# Patient Record
Sex: Female | Born: 1965 | Race: Black or African American | Hispanic: No | Marital: Married | Smoking: Never smoker
Health system: Southern US, Community
[De-identification: ages and names within clinical notes are randomized; demographics above are authoritative.]

## PROBLEM LIST (undated history)

## (undated) DIAGNOSIS — D649 Anemia, unspecified: Secondary | ICD-10-CM

## (undated) DIAGNOSIS — Z86718 Personal history of other venous thrombosis and embolism: Secondary | ICD-10-CM

## (undated) DIAGNOSIS — B019 Varicella without complication: Secondary | ICD-10-CM

## (undated) DIAGNOSIS — D689 Coagulation defect, unspecified: Secondary | ICD-10-CM

## (undated) HISTORY — PX: TUBAL LIGATION: SHX77

## (undated) HISTORY — DX: Personal history of other venous thrombosis and embolism: Z86.718

## (undated) HISTORY — PX: UMBILICAL HERNIA REPAIR: SHX196

## (undated) HISTORY — DX: Coagulation defect, unspecified: D68.9

## (undated) HISTORY — DX: Varicella without complication: B01.9

---

## 2000-08-14 ENCOUNTER — Other Ambulatory Visit: Admission: RE | Admit: 2000-08-14 | Discharge: 2000-08-14 | Payer: Self-pay | Admitting: Obstetrics and Gynecology

## 2001-07-28 ENCOUNTER — Encounter: Payer: Self-pay | Admitting: Obstetrics and Gynecology

## 2001-07-28 ENCOUNTER — Ambulatory Visit (HOSPITAL_COMMUNITY): Admission: RE | Admit: 2001-07-28 | Discharge: 2001-07-28 | Payer: Self-pay | Admitting: Obstetrics and Gynecology

## 2001-08-20 ENCOUNTER — Encounter: Payer: Self-pay | Admitting: Obstetrics and Gynecology

## 2001-08-20 ENCOUNTER — Inpatient Hospital Stay (HOSPITAL_COMMUNITY): Admission: AD | Admit: 2001-08-20 | Discharge: 2001-08-20 | Payer: Self-pay | Admitting: Obstetrics and Gynecology

## 2001-10-06 ENCOUNTER — Ambulatory Visit (HOSPITAL_COMMUNITY): Admission: RE | Admit: 2001-10-06 | Discharge: 2001-10-06 | Payer: Self-pay | Admitting: Obstetrics and Gynecology

## 2001-10-06 ENCOUNTER — Encounter: Payer: Self-pay | Admitting: Obstetrics and Gynecology

## 2002-01-10 ENCOUNTER — Ambulatory Visit (HOSPITAL_COMMUNITY): Admission: RE | Admit: 2002-01-10 | Discharge: 2002-01-10 | Payer: Self-pay | Admitting: Obstetrics and Gynecology

## 2002-02-06 ENCOUNTER — Inpatient Hospital Stay (HOSPITAL_COMMUNITY): Admission: AD | Admit: 2002-02-06 | Discharge: 2002-02-06 | Payer: Self-pay | Admitting: Obstetrics and Gynecology

## 2002-02-07 ENCOUNTER — Inpatient Hospital Stay (HOSPITAL_COMMUNITY): Admission: AD | Admit: 2002-02-07 | Discharge: 2002-02-09 | Payer: Self-pay | Admitting: Obstetrics and Gynecology

## 2002-02-09 ENCOUNTER — Ambulatory Visit (HOSPITAL_COMMUNITY): Admission: EM | Admit: 2002-02-09 | Discharge: 2002-02-09 | Payer: Self-pay | Admitting: Emergency Medicine

## 2002-02-09 ENCOUNTER — Encounter (INDEPENDENT_AMBULATORY_CARE_PROVIDER_SITE_OTHER): Payer: Self-pay | Admitting: Specialist

## 2002-02-10 ENCOUNTER — Encounter: Admission: RE | Admit: 2002-02-10 | Discharge: 2002-03-12 | Payer: Self-pay | Admitting: Obstetrics and Gynecology

## 2002-04-12 ENCOUNTER — Encounter: Admission: RE | Admit: 2002-04-12 | Discharge: 2002-05-12 | Payer: Self-pay | Admitting: Obstetrics and Gynecology

## 2002-05-13 ENCOUNTER — Encounter: Admission: RE | Admit: 2002-05-13 | Discharge: 2002-06-12 | Payer: Self-pay | Admitting: Obstetrics and Gynecology

## 2002-07-12 ENCOUNTER — Encounter: Admission: RE | Admit: 2002-07-12 | Discharge: 2002-08-11 | Payer: Self-pay | Admitting: Obstetrics and Gynecology

## 2003-08-11 ENCOUNTER — Other Ambulatory Visit: Admission: RE | Admit: 2003-08-11 | Discharge: 2003-08-11 | Payer: Self-pay | Admitting: Obstetrics and Gynecology

## 2008-08-06 ENCOUNTER — Ambulatory Visit: Payer: Self-pay | Admitting: Diagnostic Radiology

## 2008-08-06 ENCOUNTER — Emergency Department (HOSPITAL_BASED_OUTPATIENT_CLINIC_OR_DEPARTMENT_OTHER): Admission: EM | Admit: 2008-08-06 | Discharge: 2008-08-06 | Payer: Self-pay | Admitting: Emergency Medicine

## 2009-02-26 ENCOUNTER — Inpatient Hospital Stay (HOSPITAL_COMMUNITY): Admission: AD | Admit: 2009-02-26 | Discharge: 2009-02-26 | Payer: Self-pay | Admitting: Obstetrics & Gynecology

## 2009-03-13 ENCOUNTER — Ambulatory Visit: Payer: Self-pay | Admitting: Obstetrics & Gynecology

## 2009-03-13 ENCOUNTER — Encounter: Payer: Self-pay | Admitting: Obstetrics & Gynecology

## 2009-03-13 LAB — CONVERTED CEMR LAB
Basophils Absolute: 0 10*3/uL (ref 0.0–0.1)
Basophils Relative: 1 % (ref 0–1)
Eosinophils Absolute: 0.2 10*3/uL (ref 0.0–0.7)
Hemoglobin: 11.7 g/dL — ABNORMAL LOW (ref 12.0–15.0)
Hgb A2 Quant: 2.5 % (ref 2.2–3.2)
Hgb A: 97.5 % (ref 96.8–97.8)
Monocytes Absolute: 0.8 10*3/uL (ref 0.1–1.0)
Monocytes Relative: 10 % (ref 3–12)
Neutrophils Relative %: 70 % (ref 43–77)
Platelets: 272 10*3/uL (ref 150–400)
RBC: 3.96 M/uL (ref 3.87–5.11)
RDW: 15.4 % (ref 11.5–15.5)
WBC: 8.7 10*3/uL (ref 4.0–10.5)

## 2009-03-15 ENCOUNTER — Ambulatory Visit: Payer: Self-pay | Admitting: Obstetrics & Gynecology

## 2009-03-15 ENCOUNTER — Encounter: Payer: Self-pay | Admitting: Obstetrics & Gynecology

## 2009-03-29 ENCOUNTER — Ambulatory Visit (HOSPITAL_COMMUNITY): Admission: RE | Admit: 2009-03-29 | Discharge: 2009-03-29 | Payer: Self-pay | Admitting: Obstetrics & Gynecology

## 2009-04-12 ENCOUNTER — Ambulatory Visit: Payer: Self-pay | Admitting: Family Medicine

## 2009-04-19 ENCOUNTER — Encounter: Payer: Self-pay | Admitting: Family Medicine

## 2009-04-19 ENCOUNTER — Ambulatory Visit (HOSPITAL_COMMUNITY): Admission: RE | Admit: 2009-04-19 | Discharge: 2009-04-19 | Payer: Self-pay | Admitting: Family Medicine

## 2009-04-26 ENCOUNTER — Ambulatory Visit: Payer: Self-pay | Admitting: Obstetrics & Gynecology

## 2009-05-10 ENCOUNTER — Ambulatory Visit (HOSPITAL_COMMUNITY): Admission: RE | Admit: 2009-05-10 | Discharge: 2009-05-10 | Payer: Self-pay | Admitting: Family Medicine

## 2009-05-10 ENCOUNTER — Encounter: Payer: Self-pay | Admitting: Family Medicine

## 2009-05-17 ENCOUNTER — Inpatient Hospital Stay (HOSPITAL_COMMUNITY): Admission: AD | Admit: 2009-05-17 | Discharge: 2009-05-17 | Payer: Self-pay | Admitting: Obstetrics & Gynecology

## 2009-05-17 ENCOUNTER — Ambulatory Visit: Payer: Self-pay | Admitting: Family Medicine

## 2009-05-31 ENCOUNTER — Encounter: Payer: Self-pay | Admitting: Obstetrics & Gynecology

## 2009-05-31 ENCOUNTER — Ambulatory Visit: Payer: Self-pay | Admitting: Obstetrics & Gynecology

## 2009-05-31 LAB — CONVERTED CEMR LAB
HCT: 34.3 % — ABNORMAL LOW (ref 36.0–46.0)
Hemoglobin: 11.1 g/dL — ABNORMAL LOW (ref 12.0–15.0)
MCHC: 32.4 g/dL (ref 30.0–36.0)
RBC: 3.77 M/uL — ABNORMAL LOW (ref 3.87–5.11)

## 2009-06-14 ENCOUNTER — Ambulatory Visit: Payer: Self-pay | Admitting: Obstetrics & Gynecology

## 2009-06-21 ENCOUNTER — Ambulatory Visit (HOSPITAL_COMMUNITY): Admission: RE | Admit: 2009-06-21 | Discharge: 2009-06-21 | Payer: Self-pay | Admitting: Family Medicine

## 2009-06-21 ENCOUNTER — Encounter: Payer: Self-pay | Admitting: Family Medicine

## 2009-06-28 ENCOUNTER — Ambulatory Visit: Payer: Self-pay | Admitting: Family Medicine

## 2009-06-29 ENCOUNTER — Ambulatory Visit: Payer: Self-pay | Admitting: Obstetrics & Gynecology

## 2009-07-05 ENCOUNTER — Ambulatory Visit: Payer: Self-pay | Admitting: Obstetrics & Gynecology

## 2009-07-12 ENCOUNTER — Ambulatory Visit: Payer: Self-pay | Admitting: Obstetrics & Gynecology

## 2009-07-26 ENCOUNTER — Encounter: Payer: Self-pay | Admitting: Obstetrics & Gynecology

## 2009-07-26 ENCOUNTER — Ambulatory Visit: Payer: Self-pay | Admitting: Obstetrics & Gynecology

## 2009-07-31 ENCOUNTER — Inpatient Hospital Stay (HOSPITAL_COMMUNITY): Admission: AD | Admit: 2009-07-31 | Discharge: 2009-07-31 | Payer: Self-pay | Admitting: Family Medicine

## 2009-07-31 ENCOUNTER — Ambulatory Visit: Payer: Self-pay | Admitting: Family

## 2009-08-02 ENCOUNTER — Ambulatory Visit: Payer: Self-pay | Admitting: Obstetrics & Gynecology

## 2009-08-09 ENCOUNTER — Ambulatory Visit: Payer: Self-pay | Admitting: Obstetrics & Gynecology

## 2009-08-16 ENCOUNTER — Ambulatory Visit: Payer: Self-pay | Admitting: Obstetrics and Gynecology

## 2009-08-16 ENCOUNTER — Encounter (INDEPENDENT_AMBULATORY_CARE_PROVIDER_SITE_OTHER): Payer: Self-pay | Admitting: *Deleted

## 2009-08-23 ENCOUNTER — Ambulatory Visit: Payer: Self-pay | Admitting: Obstetrics & Gynecology

## 2009-08-25 ENCOUNTER — Inpatient Hospital Stay (HOSPITAL_COMMUNITY): Admission: AD | Admit: 2009-08-25 | Discharge: 2009-08-27 | Payer: Self-pay | Admitting: Family Medicine

## 2009-08-25 ENCOUNTER — Ambulatory Visit: Payer: Self-pay | Admitting: Family Medicine

## 2009-08-28 ENCOUNTER — Inpatient Hospital Stay (HOSPITAL_COMMUNITY): Admission: AD | Admit: 2009-08-28 | Discharge: 2009-08-28 | Payer: Self-pay | Admitting: Obstetrics & Gynecology

## 2009-08-28 ENCOUNTER — Ambulatory Visit: Payer: Self-pay | Admitting: Advanced Practice Midwife

## 2009-10-26 ENCOUNTER — Ambulatory Visit: Payer: Self-pay | Admitting: Obstetrics and Gynecology

## 2010-03-08 IMAGING — US US OB FOLLOW-UP
1 series · 14 of 28 positions shown · non-contrast
Comparison: none

OBSTETRICAL ULTRASOUND:
 This ultrasound was performed in The [HOSPITAL], and the [facility] report will be stored to [REDACTED] PACS.  This report is also available in [HOSPITAL]?s accessANYware.

[Series 1: us ob follow-up · 14 of 52 slices shown]
[im 2/52]
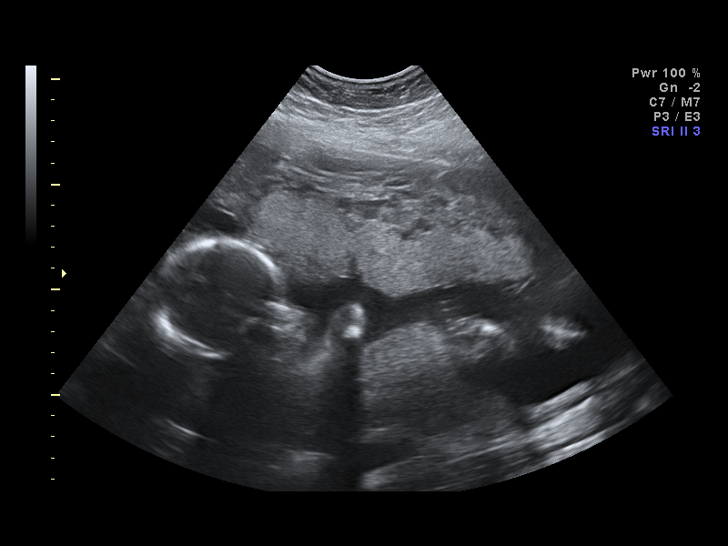
[im 6/52]
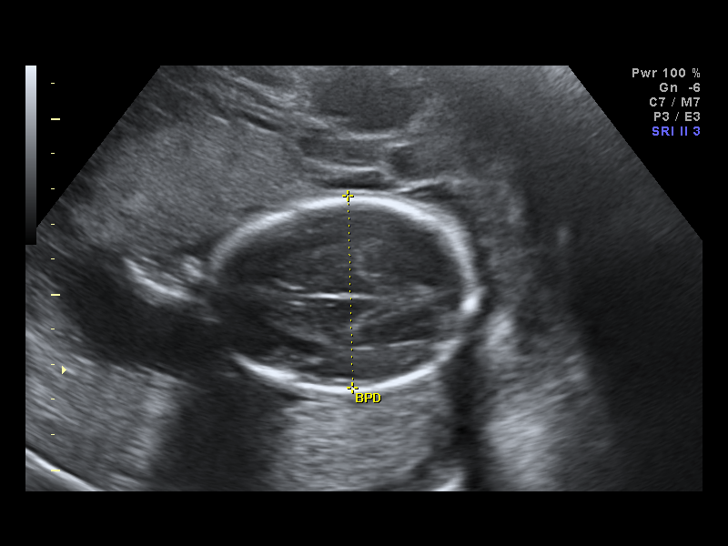
[im 10/52]
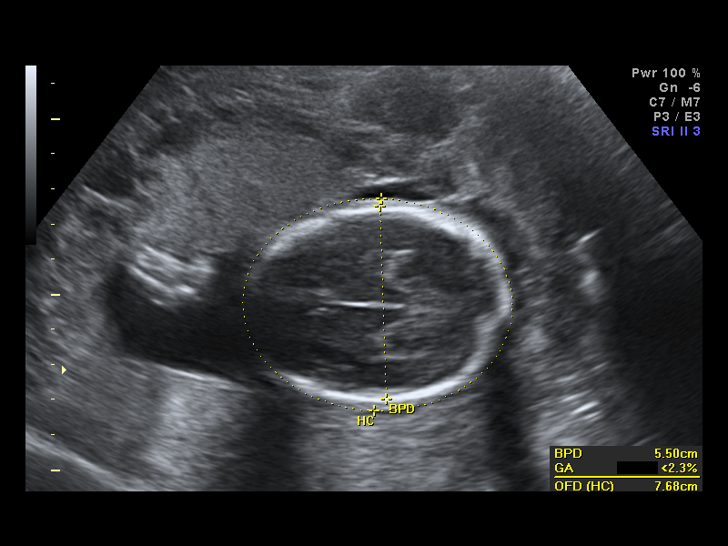
[im 14/52]
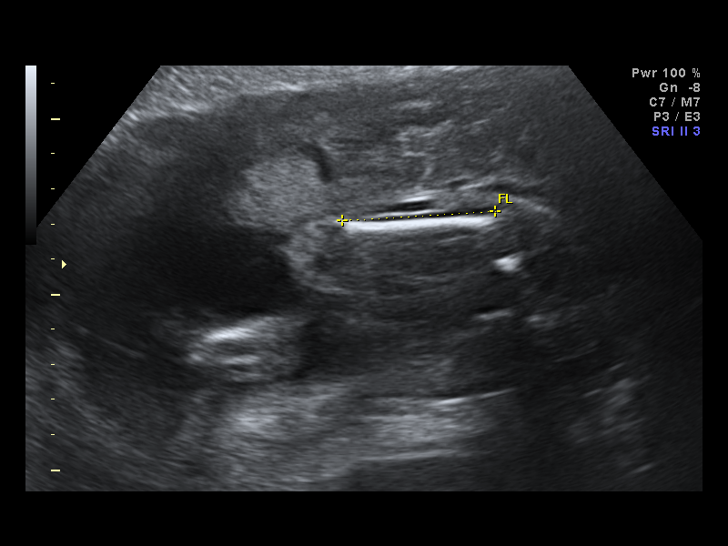
[im 18/52]
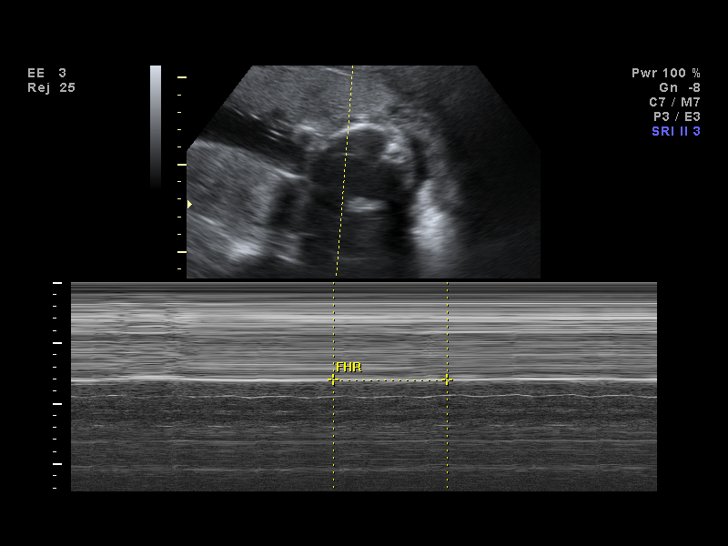
[im 21/52]
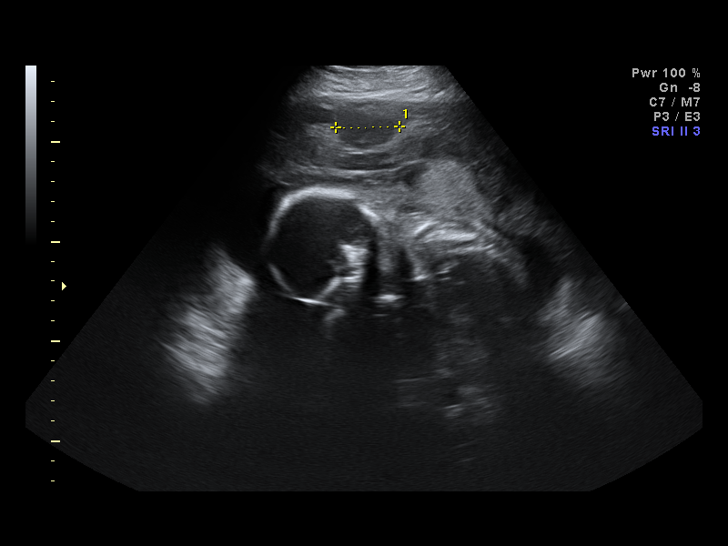
[im 25/52]
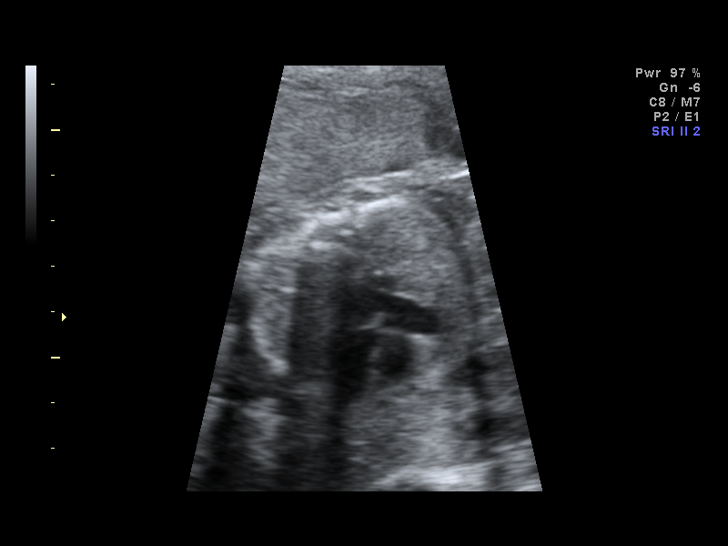
[im 29/52]
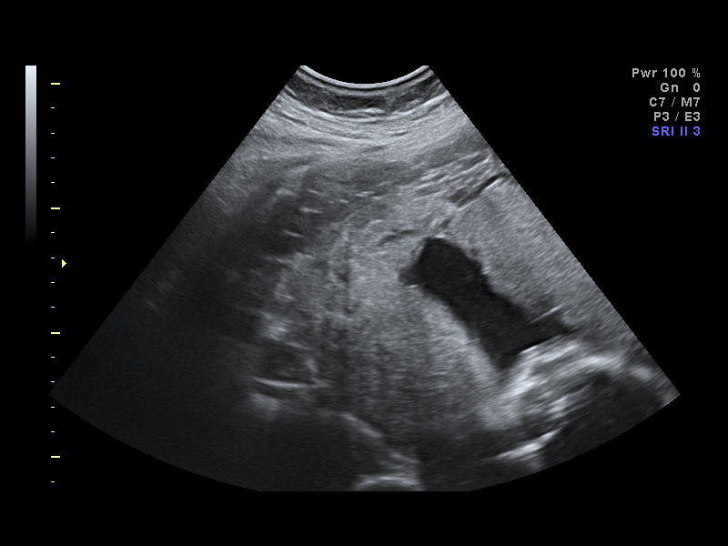
[im 33/52]
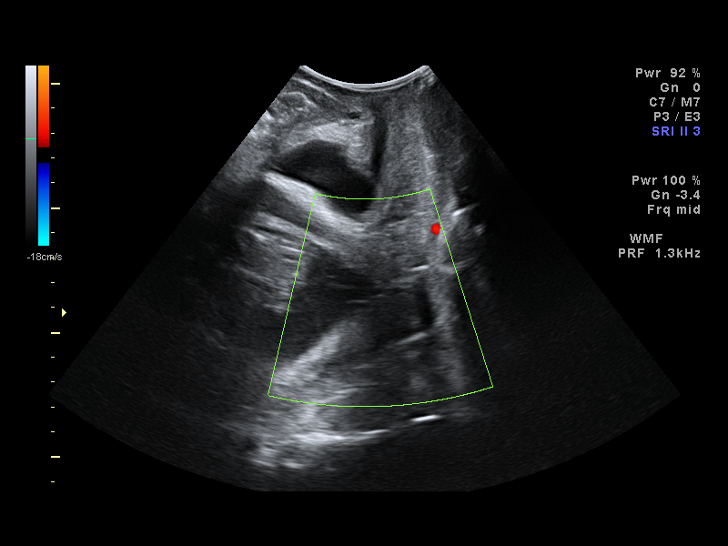
[im 36/52]
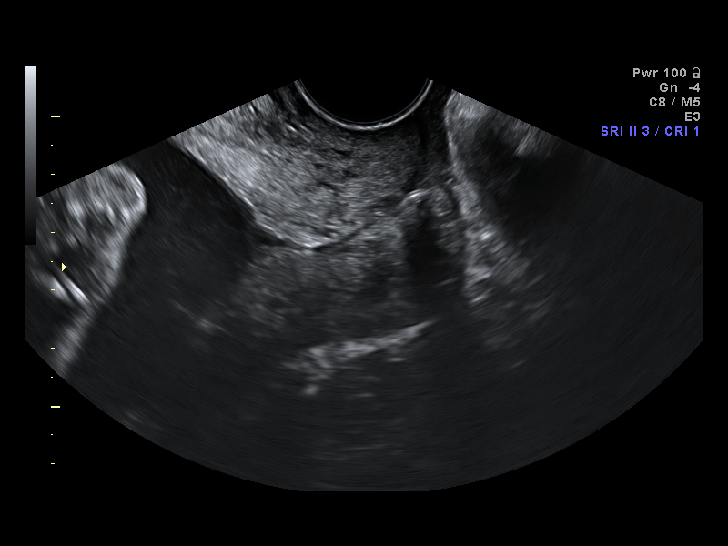
[im 40/52]
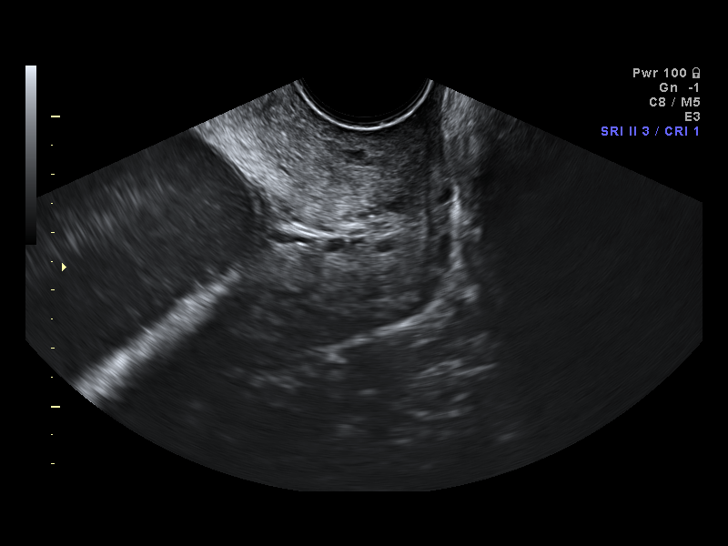
[im 44/52]
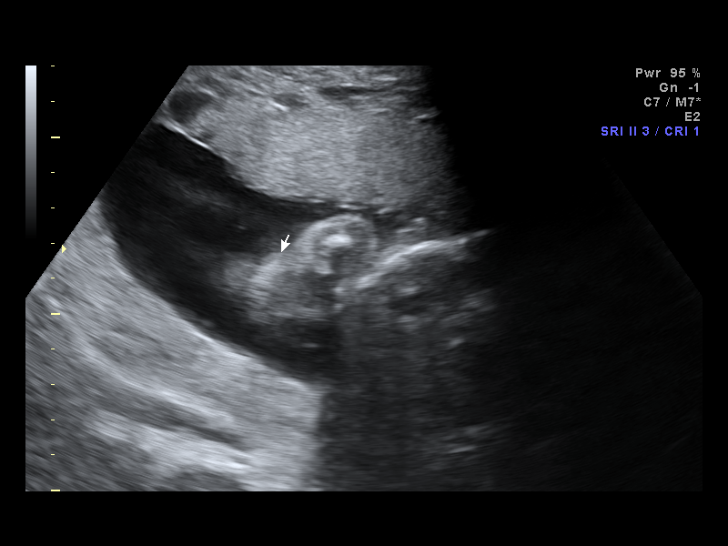
[im 48/52]
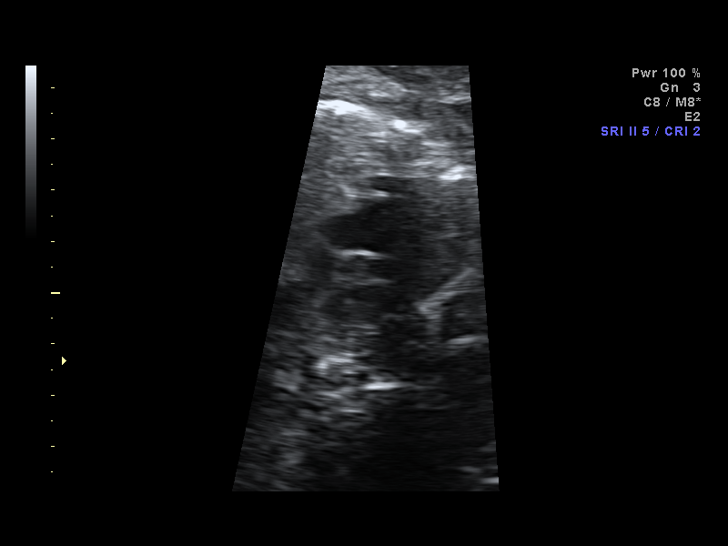
[im 52/52]
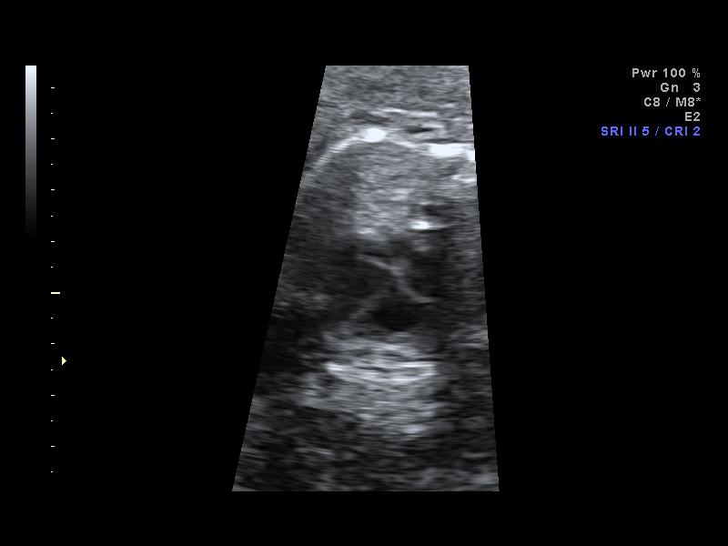

[14 of 28 positions shown; findings below may reference images not displayed]

IMPRESSION: [facility] has also been faxed to the ordering physician.

## 2010-06-16 LAB — POCT URINALYSIS DIP (DEVICE)
Bilirubin Urine: NEGATIVE
Bilirubin Urine: NEGATIVE
Glucose, UA: NEGATIVE mg/dL
Hgb urine dipstick: NEGATIVE
Nitrite: NEGATIVE
Protein, ur: NEGATIVE mg/dL
Specific Gravity, Urine: 1.015 (ref 1.005–1.030)
Specific Gravity, Urine: 1.02 (ref 1.005–1.030)
Urobilinogen, UA: 0.2 mg/dL (ref 0.0–1.0)
Urobilinogen, UA: 0.2 mg/dL (ref 0.0–1.0)
pH: 7 (ref 5.0–8.0)
pH: 6.5 (ref 5.0–8.0)

## 2010-06-17 LAB — CBC
HCT: 26.5 % — ABNORMAL LOW (ref 36.0–46.0)
HCT: 31 % — ABNORMAL LOW (ref 36.0–46.0)
Hemoglobin: 10.5 g/dL — ABNORMAL LOW (ref 12.0–15.0)
Hemoglobin: 8.5 g/dL — ABNORMAL LOW (ref 12.0–15.0)
MCHC: 32.1 g/dL (ref 30.0–36.0)
MCHC: 33.8 g/dL (ref 30.0–36.0)
MCV: 89.2 fL (ref 78.0–100.0)
MCV: 91.6 fL (ref 78.0–100.0)
Platelets: 180 K/uL (ref 150–400)
Platelets: 211 K/uL (ref 150–400)
RBC: 2.89 MIL/uL — ABNORMAL LOW (ref 3.87–5.11)
RBC: 3.47 MIL/uL — ABNORMAL LOW (ref 3.87–5.11)
RDW: 16.9 % — ABNORMAL HIGH (ref 11.5–15.5)
RDW: 17.3 % — ABNORMAL HIGH (ref 11.5–15.5)
WBC: 6.4 K/uL (ref 4.0–10.5)
WBC: 7.1 K/uL (ref 4.0–10.5)

## 2010-06-17 LAB — POCT URINALYSIS DIP (DEVICE)
Bilirubin Urine: NEGATIVE
Glucose, UA: NEGATIVE mg/dL
Glucose, UA: NEGATIVE mg/dL
Hgb urine dipstick: NEGATIVE
Nitrite: NEGATIVE
Nitrite: NEGATIVE
Urobilinogen, UA: 1 mg/dL (ref 0.0–1.0)
pH: 6.5 (ref 5.0–8.0)
pH: 6.5 (ref 5.0–8.0)

## 2010-06-17 LAB — RPR: RPR Ser Ql: NONREACTIVE

## 2010-06-18 LAB — POCT URINALYSIS DIP (DEVICE)
Bilirubin Urine: NEGATIVE
Bilirubin Urine: NEGATIVE
Glucose, UA: NEGATIVE mg/dL
Glucose, UA: NEGATIVE mg/dL
Glucose, UA: NEGATIVE mg/dL
Hgb urine dipstick: NEGATIVE
Hgb urine dipstick: NEGATIVE
Ketones, ur: NEGATIVE mg/dL
Ketones, ur: NEGATIVE mg/dL
Nitrite: NEGATIVE
Nitrite: NEGATIVE
Protein, ur: NEGATIVE mg/dL
Specific Gravity, Urine: 1.015 (ref 1.005–1.030)
pH: 6.5 (ref 5.0–8.0)

## 2010-06-19 LAB — POCT URINALYSIS DIP (DEVICE)
Bilirubin Urine: NEGATIVE
Bilirubin Urine: NEGATIVE
Hgb urine dipstick: NEGATIVE
Hgb urine dipstick: NEGATIVE
Hgb urine dipstick: NEGATIVE
Ketones, ur: NEGATIVE mg/dL
Nitrite: NEGATIVE
Nitrite: NEGATIVE
Protein, ur: 30 mg/dL — AB
Protein, ur: NEGATIVE mg/dL
pH: 6.5 (ref 5.0–8.0)
pH: 6.5 (ref 5.0–8.0)

## 2010-06-19 LAB — URINALYSIS, ROUTINE W REFLEX MICROSCOPIC
Hgb urine dipstick: NEGATIVE
Protein, ur: NEGATIVE mg/dL
pH: 7 (ref 5.0–8.0)

## 2010-06-19 LAB — WET PREP, GENITAL: Yeast Wet Prep HPF POC: NONE SEEN

## 2010-06-23 LAB — POCT URINALYSIS DIP (DEVICE)
Bilirubin Urine: NEGATIVE
Glucose, UA: NEGATIVE mg/dL
Glucose, UA: NEGATIVE mg/dL
Hgb urine dipstick: NEGATIVE
Ketones, ur: NEGATIVE mg/dL
Protein, ur: NEGATIVE mg/dL
Protein, ur: NEGATIVE mg/dL
Specific Gravity, Urine: 1.02 (ref 1.005–1.030)
Specific Gravity, Urine: 1.025 (ref 1.005–1.030)
Urobilinogen, UA: 1 mg/dL (ref 0.0–1.0)
pH: 7 (ref 5.0–8.0)

## 2010-07-01 LAB — POCT URINALYSIS DIP (DEVICE)
Bilirubin Urine: NEGATIVE
Hgb urine dipstick: NEGATIVE
Nitrite: NEGATIVE
Protein, ur: 30 mg/dL — AB
Specific Gravity, Urine: 1.025 (ref 1.005–1.030)

## 2010-07-03 LAB — URINALYSIS, ROUTINE W REFLEX MICROSCOPIC
Bilirubin Urine: NEGATIVE
Ketones, ur: NEGATIVE mg/dL
Nitrite: NEGATIVE
Specific Gravity, Urine: 1.025 (ref 1.005–1.030)
Urobilinogen, UA: 0.2 mg/dL (ref 0.0–1.0)

## 2010-07-03 LAB — URINE MICROSCOPIC-ADD ON

## 2010-07-03 LAB — WET PREP, GENITAL: Clue Cells Wet Prep HPF POC: NONE SEEN

## 2010-07-03 LAB — CBC
Hemoglobin: 11.6 g/dL — ABNORMAL LOW (ref 12.0–15.0)
RBC: 3.84 MIL/uL — ABNORMAL LOW (ref 3.87–5.11)

## 2010-08-16 NOTE — Op Note (Signed)
NAME:  Carlini, CEILI BOSHERS                           ACCOUNT NO.:  [id number]   MEDICAL RECORD NO.:  [id number]                   PATIENT TYPE:  EMS   LOCATION:  ED                                   FACILITY:  [facility]   PHYSICIAN:  Leonie Man, M.D.                DATE OF BIRTH:  [birth year]   DATE OF PROCEDURE:  02/09/2002  DATE OF DISCHARGE:                                 OPERATIVE REPORT   PREOPERATIVE DIAGNOSIS:  Incarcerated epigastric ventral hernia.   POSTOPERATIVE DIAGNOSIS:  Incarcerated epigastric ventral hernia.   PROCEDURE:  Reduction and repair of incarcerated ventral hernia.   SURGEON:  Leonie Man, M.D.   ASSISTANT:  Nurse.   ANESTHESIA:  General.   CLINICAL NOTE:  This patient is a 45 year old woman who is three days  postpartum, who noted the onset of severe supraumbilical pain with a mass.  This has caused her nausea but without vomiting.  She presented emergently  and on evaluation was noted to have an incarcerated supraumbilical  epigastric hernia.  She is brought to the operating room after the risks and  potential benefits of surgery have been fully discussed and all questions  answered.   DESCRIPTION OF PROCEDURE:  Following the induction of satisfactory general  anesthesia with positioned supinely and the abdomen routinely prepped and  draped to be included in a sterile operative field, I made a supraumbilical  incision and carried it down through the skin and subcutaneous tissue down  toward the midline, where a hernial sac with an incarcerated falciform  ligament could be felt.  The hernia sac was dissected free on all sides and  opened.  There was a substantial defect of about 3 cm in length.  In  palpating the remainder of the abdominal wall, there was also noted to be a  fairly large umbilical hernia, and so the incision carried down through the  umbilicus, down through the umbilical hernia so that that could be repaired  too.  The repair was  accomplished with a mesh plug which was placed into the  wound after Seprafilm slurry had been placed on the viscera so as to prevent  adhesion of the mesh to the viscera.  The mesh was then sewn in with a  running suture of 2-0 Novofil in two segments, securing the mesh to the  surrounding fascia.  Following this, the overlying fascia was then closed  with a running suture of 2-0 Novofil.  Sponge, instrument, and sharp counts  were then verified.  The subcutaneous tissue was closed with a running 2-0  Vicryl and the skin was closed with a running 4-0 Monocryl suture.  Sterile  dressings were applied and the anesthetic reversed and the patient removed  from the operating room to the recovery room in stable condition.  She  tolerated the procedure well.  Leonie Man, M.D.    PB/MEDQ  D:  02/09/2002  T:  02/10/2002  Job:  [id number]

## 2010-08-16 NOTE — H&P (Signed)
NAME:  Sarah Hudson, Sarah Hudson                           ACCOUNT NO.:  [id number]   MEDICAL RECORD NO.:  [id number]                   PATIENT TYPE:  INP   LOCATION:  9122                                 FACILITY:  [facility]   PHYSICIAN:  Hal Morales, M.D.             DATE OF BIRTH:  06/16/1965   DATE OF ADMISSION:  02/07/2002  DATE OF DISCHARGE:                                HISTORY & PHYSICAL   HISTORY OF PRESENT ILLNESS:  The patient is a 45 year old married African-  American female.  She is a gravida 10, para 4-1-4-5 who presents at 37  weeks.  EDD February 28, 2002 in active labor with precipitous spontaneous  vaginal delivery for a viable female infant named [place] with Apgar scores  of 9 at one minute, 9 at five minutes.  The patient spontaneously ruptured  her membranes at home at approximately 0130 a.m. for clear fluid.  She  reported positive fetal movement, no bleeding.  Her pregnancy was followed  by the M.D. service at [facility] and is remarkable for grand multiparity, history  of preterm labor and delivery at 32 weeks, preterm labor x2, preterm  delivery x1, history of ovarian cyst (simple), equivocal rubella, history of  rapid labor, history of four elective ABs, family history of polydactyly,  group B Strep positive.   OB HISTORY:  In 1982 patient had a normal spontaneous vaginal delivery at  home at 38 weeks with the birth of a 4 pound 10 ounce female infant named  [place] with no complications.  In 1984, 1986, 1988, and 1990 patient had  four elective ABs with no complications.  In 1992 patient had a normal  spontaneous vaginal delivery with the birth of a 6 pound 11 ounce female  infant at 70 weeks named Britani with no complications.  In 1993 patient had  a normal spontaneous vaginal delivery at 32 weeks of a 3 pound 14 ounce female  infant with premature rupture of membranes named Jomarie Longs.  In 1995 patient  had a normal spontaneous vaginal delivery with the birth of a 6 pound 9  ounce female infant at 39 weeks named Ivin Booty.  In 1998 patient had a normal  spontaneous vaginal delivery with the birth of a 7 pound 2 ounce female infant  at 80 weeks named Riki Rusk.   This patient was initially evaluated at the office of [facility] in April 2003 at  7 weeks 3 days.  She was diagnosed early in pregnancy with a right ovarian  cyst on ultrasound from right lower quadrant pain.  The patient's pregnancy  progressed normally.  She was monitored for premature labor.  She has been  size equal to dates throughout, normotensive, with no proteinuria.   PRENATAL LABORATORY WORK:  On July 03, 2001 hemoglobin and hematocrit 12.2  and 34.5, platelets 243,000.  Blood type and Rh O+.  Antibody screen  negative.  Sickle cell trait  AA2.  VDRL nonreactive.  Rubella equivocal.  Hepatitis B surface antigen negative.  HIV nonreactive.  Pap smear within  normal limits.  AFP/free beta hCG declined.  At 28 weeks one hour glucose  challenge 121.  At 36 weeks culture of the vaginal tract is positive for  group B Strep.   PAST MEDICAL HISTORY:  Premature labor with last two pregnancies.  Elective  AB x4.   PAST SURGICAL HISTORY:  Oral surgery in 2002.   FAMILY HISTORY:  Maternal grandfather with a history of MI.  The patient's  mother with chronic hypertension and diabetes.  Paternal aunt with lung  cancer.  Paternal aunt smoker.  The patient's brother with a history of drug  abuse, recovered.   GENETIC HISTORY:  Father of the baby, father of the baby's mother, and  patient's son with polydactyly.  The patient's maternal grandmother had one  set of twins and those children also had one set of twins.   SOCIAL HISTORY:  The patient is a 45 year old African-American female.  Her  husband, Jomarie Longs Weismann, Montez Hageman. is involved and supportive.  They are [place]  in their faith.  The patient denies the use of tobacco, alcohol, or illicit  drugs.   ALLERGIES:  There are no known drug allergies.   REVIEW OF  SYSTEMS:  As described above.  The patient was admitted in active  labor with precipitous delivery.   PHYSICAL EXAMINATION:  VITAL SIGNS:  Stable, afebrile.  HEENT:  Unremarkable.  HEART:  Regular rate and rhythm.  LUNGS:  Clear.  ABDOMEN:  Gravid in its contour.  Uterine fundus is noted to extend 37 cm  above the Carreto of the pubic symphysis.  Leopold's maneuvers finds the  infant to be in a longitudinal lie, cephalic presentation, and the estimated  fetal weight is 6 pounds.  The fetal heart rate is reactive and reassuring.  The patient is contracting every two to three minutes.  PELVIC:  Digital examination of the cervix on arrival to birthing suite by  R.N. was 9 cm.  C.N.M. was present in the room at that time.  The C.N.M.  walked out to the nursing station to retrieve patient's prenatal record and  to call M.D. when patient delivered precipitously in the bed with the R.N.  present.   ASSESSMENT:  Active labor and precipitous delivery.   PLAN:  Admit per Dr. Dierdre Forth.     Rica Koyanagi, C.N.M.               Hal Morales, M.D.    SDM/MEDQ  D:  02/07/2002  T:  02/07/2002  Job:  [id number]

## 2014-05-01 ENCOUNTER — Emergency Department (HOSPITAL_COMMUNITY)
Admission: EM | Admit: 2014-05-01 | Discharge: 2014-05-01 | Disposition: A | Discharge status: Home / Self Care | Payer: BLUE CROSS/BLUE SHIELD | Attending: Emergency Medicine | Admitting: Emergency Medicine

## 2014-05-01 ENCOUNTER — Encounter (HOSPITAL_COMMUNITY): Payer: Self-pay | Admitting: *Deleted

## 2014-05-01 DIAGNOSIS — Z3202 Encounter for pregnancy test, result negative: Secondary | ICD-10-CM | POA: Diagnosis not present

## 2014-05-01 DIAGNOSIS — M791 Myalgia: Secondary | ICD-10-CM | POA: Diagnosis not present

## 2014-05-01 DIAGNOSIS — M79605 Pain in left leg: Secondary | ICD-10-CM | POA: Diagnosis present

## 2014-05-01 DIAGNOSIS — I82402 Acute embolism and thrombosis of unspecified deep veins of left lower extremity: Secondary | ICD-10-CM | POA: Insufficient documentation

## 2014-05-01 DIAGNOSIS — M79606 Pain in leg, unspecified: Secondary | ICD-10-CM

## 2014-05-01 LAB — POC URINE PREG, ED: PREG TEST UR: NEGATIVE

## 2014-05-01 MED ORDER — RIVAROXABAN 15 MG PO TABS
15.0000 mg | ORAL_TABLET | Freq: Once | ORAL | Status: AC
  Administered 2014-05-01: 15 mg via ORAL
  Filled 2014-05-01: qty 1

## 2014-05-01 MED ORDER — XARELTO VTE STARTER PACK 15 & 20 MG PO TBPK: 15.0000 mg | ORAL_TABLET | ORAL | Status: DC

## 2014-05-01 MED ORDER — RIVAROXABAN (XARELTO) EDUCATION KIT FOR DVT/PE PATIENTS
PACK | Freq: Once | Status: AC
  Administered 2014-05-01: 13:00:00
  Filled 2014-05-01: qty 1

## 2014-05-01 NOTE — Discharge Instructions (Signed)
Please call your doctor for a followup appointment within 24-48 hours. When you talk to your doctor please let them know that you were seen in the emergency department and have them acquire all of your records so that they can discuss the findings with you and formulate a treatment plan to fully care for your new and ongoing problems. Please follow-up with her primary care provider Please rest and stay hydrated Please take medications as prescribed on a full stomach Please continue to monitor symptoms closely and if symptoms are to worsen or change (fever greater than 101, chills, sweating, nausea, vomiting, chest pain, shortness of breathe, difficulty breathing, weakness, numbness, tingling, worsening or changes to pain pattern, fall, injury, increased swelling, coughing up blood) please report back to the Emergency Department immediately.    Deep Vein Thrombosis A deep vein thrombosis (DVT) is a blood clot that develops in the deep, larger veins of the leg, arm, or pelvis. These are more dangerous than clots that might form in veins near the surface of the body. A DVT can lead to serious and even life-threatening complications if the clot breaks off and travels in the bloodstream to the lungs.  A DVT can damage the valves in your leg veins so that instead of flowing upward, the blood pools in the lower leg. This is called post-thrombotic syndrome, and it can result in pain, swelling, discoloration, and sores on the leg. CAUSES Usually, several things contribute to the formation of blood clots. Contributing factors include:  The flow of blood slows down.  The inside of the vein is damaged in some way.  You have a condition that makes blood clot more easily. RISK FACTORS Some people are more likely than others to develop blood clots. Risk factors include:   Smoking.  Being overweight (obese).  Sitting or lying still for a long time. This includes long-distance travel, paralysis, or recovery  from an illness or surgery. Other factors that increase risk are:   Older age, especially over 102 years of age.  Having a family history of blood clots or if you have already had a blot clot.  Having major or lengthy surgery. This is especially true for surgery on the hip, knee, or belly (abdomen). Hip surgery is particularly high risk.  Having a long, thin tube (catheter) placed inside a vein during a medical procedure.  Breaking a hip or leg.  Having cancer or cancer treatment.  Pregnancy and childbirth.  Hormone changes make the blood clot more easily during pregnancy.  The fetus puts pressure on the veins of the pelvis.  There is a risk of injury to veins during delivery or a caesarean delivery. The risk is highest just after childbirth.  Medicines containing the female hormone estrogen. This includes birth control pills and hormone replacement therapy.  Other circulation or heart problems.  SIGNS AND SYMPTOMS When a clot forms, it can either partially or totally block the blood flow in that vein. Symptoms of a DVT can include:  Swelling of the leg or arm, especially if one side is much worse.  Warmth and redness of the leg or arm, especially if one side is much worse.  Pain in an arm or leg. If the clot is in the leg, symptoms may be more noticeable or worse when standing or walking. The symptoms of a DVT that has traveled to the lungs (pulmonary embolism, PE) usually start suddenly and include:  Shortness of breath.  Coughing.  Coughing up blood or blood-tinged  mucus.  Chest pain. The chest pain is often worse with deep breaths.  Rapid heartbeat. Anyone with these symptoms should get emergency medical treatment right away. Do not wait to see if the symptoms will go away. Call your local emergency services (911 in the [place] if you have these symptoms. Do not drive yourself to the hospital. DIAGNOSIS If a DVT is suspected, your health care provider will take a full  medical history and perform a physical exam. Tests that also may be required include:  Blood tests, including studies of the clotting properties of the blood.  Ultrasound to see if you have clots in your legs or lungs.  X-rays to show the flow of blood when dye is injected into the veins (venogram).  Studies of your lungs if you have any chest symptoms. PREVENTION  Exercise the legs regularly. Take a brisk 30-minute walk every day.  Maintain a weight that is appropriate for your height.  Avoid sitting or lying in bed for long periods of time without moving your legs.  Women, particularly those over the age of 60 years, should consider the risks and benefits of taking estrogen medicines, including birth control pills.  Do not smoke, especially if you take estrogen medicines.  Long-distance travel can increase your risk of DVT. You should exercise your legs by walking or pumping the muscles every hour.  Many of the risk factors above relate to situations that exist with hospitalization, either for illness, injury, or elective surgery. Prevention may include medical and nonmedical measures.  Your health care provider will assess you for the need for venous thromboembolism prevention when you are admitted to the hospital. If you are having surgery, your surgeon will assess you the day of or day after surgery. TREATMENT Once identified, a DVT can be treated. It can also be prevented in some circumstances. Once you have had a DVT, you may be at increased risk for a DVT in the future. The most common treatment for DVT is blood-thinning (anticoagulant) medicine, which reduces the blood's tendency to clot. Anticoagulants can stop new blood clots from forming and stop old clots from growing. They cannot dissolve existing clots. Your body does this by itself over time. Anticoagulants can be given by mouth, through an IV tube, or by injection. Your health care provider will determine the best program  for you. Other medicines or treatments that may be used are:  Heparin or related medicines (low molecular weight heparin) are often the first treatment for a blood clot. They act quickly. However, they cannot be taken orally and must be given either in shot form or by IV tube.  Heparin can cause a fall in a component of blood that stops bleeding and forms blood clots (platelets). You will be monitored with blood tests to be sure this does not occur.  Warfarin is an anticoagulant that can be swallowed. It takes a few days to start working, so usually heparin or related medicines are used in combination. Once warfarin is working, heparin is usually stopped.  Factor Xa inhibitor medicines, such as rivaroxaban and apixaban, also reduce blood clotting. These medicines are taken orally and can often be used without heparin or related medicines.  Less commonly, clot dissolving drugs (thrombolytics) are used to dissolve a DVT. They carry a high risk of bleeding, so they are used mainly in severe cases where your life or a part of your body is threatened.  Very rarely, a blood clot in the leg needs to  be removed surgically.  If you are unable to take anticoagulants, your health care provider may arrange for you to have a filter placed in a main vein in your abdomen. This filter prevents clots from traveling to your lungs. HOME CARE INSTRUCTIONS  Take all medicines as directed by your health care provider.  Learn as much as you can about DVT.  Wear a medical alert bracelet or carry a medical alert card.  Ask your health care provider how soon you can go back to normal activities. It is important to stay active to prevent blood clots. If you are on anticoagulant medicine, avoid contact sports.  It is very important to exercise. This is especially important while traveling, sitting, or standing for long periods of time. Exercise your legs by walking or by tightening and relaxing your leg muscles  regularly. Take frequent walks.  You may need to wear compression stockings. These are tight elastic stockings that apply pressure to the lower legs. This pressure can help keep the blood in the legs from clotting. Taking Warfarin Warfarin is a daily medicine that is taken by mouth. Your health care provider will advise you on the length of treatment (usually 3-6 months, sometimes lifelong). If you take warfarin:  Understand how to take warfarin and foods that can affect how warfarin works in Veterinary surgeon.  Too much and too little warfarin are both dangerous. Too much warfarin increases the risk of bleeding. Too little warfarin continues to allow the risk for blood clots. Warfarin and Regular Blood Testing While taking warfarin, you will need to have regular blood tests to measure your blood clotting time. These blood tests usually include both the prothrombin time (PT) and international normalized ratio (INR) tests. The PT and INR results allow your health care provider to adjust your dose of warfarin. It is very important that you have your PT and INR tested as often as directed by your health care provider.  Warfarin and Your Diet Avoid major changes in your diet, or notify your health care provider before changing your diet. Arrange a visit with a registered dietitian to answer your questions. Many foods, especially foods high in vitamin K, can interfere with warfarin and affect the PT and INR results. You should eat a consistent amount of foods high in vitamin K. Foods high in vitamin K include:   Spinach, kale, broccoli, cabbage, collard and turnip greens, Brussels sprouts, peas, cauliflower, seaweed, and parsley.  Beef and pork liver.  Green tea.  Soybean oil. Warfarin with Other Medicines Many medicines can interfere with warfarin and affect the PT and INR results. You must:  Tell your health care provider about any and all medicines, vitamins, and supplements you take, including  aspirin and other over-the-counter anti-inflammatory medicines. Be especially cautious with aspirin and anti-inflammatory medicines. Ask your health care provider before taking these.  Do not take or discontinue any prescribed or over-the-counter medicine except on the advice of your health care provider or pharmacist. Warfarin Side Effects Warfarin can have side effects, such as easy bruising and difficulty stopping bleeding. Ask your health care provider or pharmacist about other side effects of warfarin. You will need to:  Hold pressure over cuts for longer than usual.  Notify your dentist and other health care providers that you are taking warfarin before you undergo any procedures where bleeding may occur. Warfarin with Alcohol and Tobacco   Drinking alcohol frequently can increase the effect of warfarin, leading to excess bleeding. It is best  to avoid alcoholic drinks or to consume only very small amounts while taking warfarin. Notify your health care provider if you change your alcohol intake.   Do not use any tobacco products including cigarettes, chewing tobacco, or electronic cigarettes. If you smoke, quit. Ask your health care provider for help with quitting smoking. Alternative Medicines to Warfarin: Factor Xa Inhibitor Medicines  These blood-thinning medicines are taken by mouth, usually for several weeks or longer. It is important to take the medicine every single day at the same time each day.  There are no regular blood tests required when using these medicines.  There are fewer food and drug interactions than with warfarin.  The side effects of this class of medicine are similar to those of warfarin, including excessive bruising or bleeding. Ask your health care provider or pharmacist about other potential side effects. SEEK MEDICAL CARE IF:  You notice a rapid heartbeat.  You feel weaker or more tired than usual.  You feel faint.  You notice increased  bruising.  You feel your symptoms are not getting better in the time expected.  You believe you are having side effects of medicine. SEEK IMMEDIATE MEDICAL CARE IF:  You have chest pain.  You have trouble breathing.  You have new or increased swelling or pain in one leg.  You cough up blood.  You notice blood in vomit, in a bowel movement, or in urine. MAKE SURE YOU:  Understand these instructions.  Will watch your condition.  Will get help right away if you are not doing well or get worse. Document Released: 03/17/2005 Document Revised: 08/01/2013 Document Reviewed: 11/22/2012 [facility] Patient Information 2015 [place], [place]. This information is not intended to replace advice given to you by your health care provider. Make sure you discuss any questions you have with your health care provider.  Information on my medicine - XARELTO (rivaroxaban)  This medication education was reviewed with me or my healthcare representative as part of my discharge preparation.  The pharmacist that spoke with me during my hospital stay was:  Jidenna Figgs, Rande Lawman, [place]? Xarelto was prescribed to treat blood clots that may have been found in the veins of your legs (deep vein thrombosis) or in your lungs (pulmonary embolism) and to reduce the risk of them occurring again.  What do you need to know about Xarelto? The starting dose is one 15 mg tablet taken TWICE daily with food for the FIRST 21 DAYS then the dose is changed to one 20 mg tablet taken ONCE A DAY with your evening meal.  DO NOT stop taking Xarelto without talking to the health care provider who prescribed the medication.  Refill your prescription for 20 mg tablets before you run out.  After discharge, you should have regular check-up appointments with your healthcare provider that is prescribing your Xarelto.  In the future your dose may need to be changed if your kidney function changes by a  significant amount.  What do you do if you miss a dose? If you are taking Xarelto TWICE DAILY and you miss a dose, take it as soon as you remember. You may take two 15 mg tablets (total 30 mg) at the same time then resume your regularly scheduled 15 mg twice daily the next day.  If you are taking Xarelto ONCE DAILY and you miss a dose, take it as soon as you remember on the same day then continue your regularly scheduled once daily  regimen the next day. Do not take two doses of Xarelto at the same time.   Important Safety Information Xarelto is a blood thinner medicine that can cause bleeding. You should call your healthcare provider right away if you experience any of the following: ? Bleeding from an injury or your nose that does not stop. ? Unusual colored urine (red or dark brown) or unusual colored stools (red or black). ? Unusual bruising for unknown reasons. ? A serious fall or if you hit your head (even if there is no bleeding).  Some medicines may interact with Xarelto and might increase your risk of bleeding while on Xarelto. To help avoid this, consult your healthcare provider or pharmacist prior to using any new prescription or non-prescription medications, including herbals, vitamins, non-steroidal anti-inflammatory drugs (NSAIDs) and supplements.  This website has more information on Xarelto: [email or web address]-benson.[email or web address]

## 2014-05-01 NOTE — Progress Notes (Signed)
Bilateral lower extremity venous duplex completed.  Right:  No evidence of DVT, superficial thrombosis, or Baker's cyst.  Left: DVT noted in the peroneal vein.  No evidence of superficial thrombosis.  No Baker's cyst.

## 2014-05-01 NOTE — ED Notes (Signed)
Declined W/C at D/C and was escorted to lobby by RN. 

## 2014-05-01 NOTE — ED Provider Notes (Signed)
CSN: [id number]     Arrival date & time 05/01/14  1062 This chart was scribed for non-physician practitioner, Jamse Mead, PA-C, working with Richarda Blade, MD by Ladene Artist, ED Scribe. This patient was seen in room TR08C/TR08C and the patient's care was started at 9:24 AM.   Chief Complaint  Patient presents with  . Leg Pain   The history is provided by the patient. No language interpreter was used.   HPI Comments: Sarah Hudson is a 49 y.o. female, with a h/o tubal ligation and hernia repair, who presents to the Emergency Department complaining of constant L leg pain onset 2 weeks ago. Pt reports taking a trip to [place] on 04/14/14-04/17/14 in which she drove there and back. She states that she took a few breaks while driving but also reports a 5 hour stretch. Pt reports associated L calf pain since 04/17/14 and SOB last week that has resolved. Pt describes pain as a constant, nagging sensation but reports a sharp sensation this morning upon waking. Pain is improved with ambulating. Pt denies calf swelling but reports tightness. Pt also denies color change, warmth, fever, chest pain, neck pain, neck stiffness, numbness. No h/o similar pain, blood clot. Pt denies taking BC or Estrogen. No falls or injury. Denied history of blood clotting disorders or coagulation disorders. Denied issues with platelets. Denied issues with GI bleeding. No PCP  History reviewed. No pertinent past medical history. Past Surgical History  Procedure Laterality Date  . Umbilical hernia repair    . Tubal ligation     History reviewed. No pertinent family history. History  Substance Use Topics  . Smoking status: Never Smoker   . Smokeless tobacco: Never Used  . Alcohol Use: No   OB History    No data available     Review of Systems  Constitutional: Negative for fever.  Respiratory: Positive for shortness of breath (resolved).   Cardiovascular: Negative for chest pain and leg swelling.  Musculoskeletal:  Positive for myalgias. Negative for neck pain and neck stiffness.  Skin: Negative for color change.  Neurological: Negative for numbness.   Allergies  Review of patient's allergies indicates not on file.  Home Medications   Prior to Admission medications   Medication Sig Start Date End Date Taking? Authorizing Provider  XARELTO STARTER PACK 15 & 20 MG TBPK Take 15-20 mg by mouth as directed. Take as directed on package: Start with one 46m tablet by mouth twice a day with food. On Day 22, switch to one 215mtablet once a day with food. 05/01/14   Casidee Jann, PA-C   Triage Vitals: BP 146/87 mmHg  Temp(Src) 97.4 F (36.3 C) (Oral)  Resp 16  Ht _0  (1.626 m)  Wt 220 lb (99.791 kg)  BMI 37.74 kg/m2  SpO2 100%  LMP 03/31/2014 Physical Exam  Constitutional: She is oriented to person, place, and time. She appears well-developed and well-nourished. No distress.  HENT:  Head: Normocephalic and atraumatic.  Eyes: EOM are normal. Right eye exhibits no discharge. Left eye exhibits no discharge.  Neck: Normal range of motion. Neck supple.  Cardiovascular: Normal rate, regular rhythm and normal heart sounds.  Exam reveals no friction rub.   No murmur heard. Pulses:      Radial pulses are 2+ on the right side, and 2+ on the left side.       Dorsalis pedis pulses are 2+ on the right side, and 2+ on the left side.  Cap refill <  3 seconds  Positive Homans sign to left lower extremity Negative malleolus ulceration Negative changes to skin colored Negative erythema or swelling identified to the left calf  Pulmonary/Chest: Effort normal and breath sounds normal. No respiratory distress. She has no wheezes. She has no rales.  Musculoskeletal: Normal range of motion.  Full ROM to upper and lower extremities without difficulty noted, negative ataxia noted.  Neurological: She is alert and oriented to person, place, and time. No cranial nerve deficit. She exhibits normal muscle tone.  Coordination normal.  Skin: Skin is warm and dry. No rash noted. She is not diaphoretic. No erythema.  Psychiatric: She has a normal mood and affect. Her behavior is normal. Thought content normal.  Nursing note and vitals reviewed.   ED Course  Procedures (including critical care time) DIAGNOSTIC STUDIES: Oxygen Saturation is 100% on RA, normal by my interpretation.    COORDINATION OF CARE: 9:30 AM-Discussed treatment plan which includes doppler and d-dimer with pt at bedside and pt agreed to plan.   Results for orders placed or performed during the hospital encounter of 05/01/14  POC urine preg, ED (not at [facility])  Result Value Ref Range   Preg Test, Ur NEGATIVE NEGATIVE    Labs Review Labs Reviewed  POC URINE PREG, ED   Imaging Review No results found.   EKG Interpretation None       10:07 AM This provider discussed case with attending physician, Dr. Crosby Oyster. As per physician recommended bilateral doppler and d-dimer to be performed.   11:27 AM Spoke with Dr. Crosby Oyster - reviewed Doppler. As per physician, recommended patient to be started on Xarelto and for d-dimer to be discontinued since blood clot is found.   MDM   Final diagnoses:  DVT (deep venous thrombosis), left    Medications  rivaroxaban (XARELTO) Education Kit for DVT/PE patients (not administered)  Rivaroxaban (XARELTO) tablet 15 mg (not administered)    Filed Vitals:   05/01/14 0903 05/01/14 1228  BP: 146/87 125/79  Pulse:  97  Temp: 97.4 F (36.3 C) 98 F (36.7 C)  TempSrc: Oral Oral  Resp: 16 22  Height: _0  (1.626 m)   Weight: 220 lb (99.791 kg)   SpO2: 100% 99%   I personally performed the services described in this documentation, which was scribed in my presence. The recorded information has been reviewed and is accurate.  Urine pregnancy negative. Doppler of the right lower extremity negative for DVT and Baker's cyst. Doppler of the left lower extremity identified DVT in the peroneal  vein. Discussed case in great detail with attending physician, recommended d-dimer to be discontinued and for patient to be started on Xarelto. Pharmacy to come down and educated patient regarding Xarelto use. Patient stable, afebrile. Patient not septic appearing. Patient presenting to the ED with DVT of the left lower extremity. Discharge patient with Xarelto. Pharmacy educate patient on Xarelto. Referred patient to primary care provider and for patient to follow-up regarding DVT. Discussed with patient that repeat Doppler will need to be performed within the next couple of weeks. Discussed the patient's signs and symptoms to watch out for. Discussed with patient to closely monitor symptoms and if symptoms are to worsen or change to report back to the ED - strict return instructions given.  Patient agreed to plan of care, understood, all questions answered.   Jamse Mead, PA-C 05/01/14 [place], PA-C 05/01/14 [place], MD 05/01/14 [phone]

## 2014-05-01 NOTE — ED Notes (Signed)
Pt reports the LT calf pain started on 04-17-14 after a car ride to [place]. Pt states her leg does not feel swollen .

## 2014-12-13 ENCOUNTER — Encounter: Payer: Self-pay | Admitting: Family

## 2014-12-13 ENCOUNTER — Other Ambulatory Visit (INDEPENDENT_AMBULATORY_CARE_PROVIDER_SITE_OTHER): Payer: BLUE CROSS/BLUE SHIELD

## 2014-12-13 ENCOUNTER — Ambulatory Visit (INDEPENDENT_AMBULATORY_CARE_PROVIDER_SITE_OTHER): Payer: BLUE CROSS/BLUE SHIELD | Admitting: Family

## 2014-12-13 VITALS — BP 132/78 | HR 78 | Temp 97.7°F | Resp 18 | Ht 64.5 in | Wt 225.0 lb

## 2014-12-13 DIAGNOSIS — R208 Other disturbances of skin sensation: Secondary | ICD-10-CM

## 2014-12-13 DIAGNOSIS — R200 Anesthesia of skin: Secondary | ICD-10-CM | POA: Insufficient documentation

## 2014-12-13 LAB — CBC
HEMATOCRIT: 32.5 % — AB (ref 36.0–46.0)
Hemoglobin: 10.4 g/dL — ABNORMAL LOW (ref 12.0–15.0)
MCHC: 32.1 g/dL (ref 30.0–36.0)
MCV: 75.6 fl — AB (ref 78.0–100.0)
PLATELETS: 292 10*3/uL (ref 150.0–400.0)
RBC: 4.29 Mil/uL (ref 3.87–5.11)
RDW: 20.2 % — ABNORMAL HIGH (ref 11.5–15.5)
WBC: 6.4 10*3/uL (ref 4.0–10.5)

## 2014-12-13 LAB — BASIC METABOLIC PANEL
BUN: 9 mg/dL (ref 6–23)
CO2: 23 meq/L (ref 19–32)
Calcium: 9.1 mg/dL (ref 8.4–10.5)
Chloride: 107 mEq/L (ref 96–112)
Creatinine, Ser: 0.81 mg/dL (ref 0.40–1.20)
GFR: 97.48 mL/min (ref >60.00)
GLUCOSE: 92 mg/dL (ref 70–99)
POTASSIUM: 4.2 meq/L (ref 3.5–5.1)
SODIUM: 138 meq/L (ref 135–145)

## 2014-12-13 LAB — IBC PANEL
Iron: 32 ug/dL — ABNORMAL LOW (ref 42–145)
Saturation Ratios: 6.9 % — ABNORMAL LOW (ref 20.0–50.0)
TRANSFERRIN: 329 mg/dL (ref 212.0–360.0)

## 2014-12-13 LAB — FERRITIN: Ferritin: 5.3 ng/mL — ABNORMAL LOW (ref 10.0–291.0)

## 2014-12-13 LAB — VITAMIN B12: VITAMIN B 12: 346 pg/mL (ref 211–911)

## 2014-12-13 LAB — HEMOGLOBIN A1C: Hgb A1c MFr Bld: 5.5 % (ref 4.6–6.5)

## 2014-12-13 LAB — TSH: TSH: 0.89 u[IU]/mL (ref 0.35–4.50)

## 2014-12-13 MED ORDER — FERRALET 90 90-1 MG PO TABS: 1.0000 | ORAL_TABLET | Freq: Every day | ORAL | Status: DC

## 2014-12-13 NOTE — Patient Instructions (Signed)
Thank you for choosing [facility].  Summary/Instructions:  Please schedule a time for a physical at your convenience   Please stop by the lab on the basement [place] of the building for your blood work. Your results will be released to [place] (or called to you) after review, usually within 72 hours after test completion. If any changes need to be made, you will be notified at that same time.  If your symptoms worsen or fail to improve, please contact our office for further instruction, or in case of emergency go directly to the emergency room at the closest medical facility.

## 2014-12-13 NOTE — Progress Notes (Signed)
Subjective:    Patient ID: Sarah Hudson, female    DOB: [birth year], 49 y.o.   MRN: [id number]  Chief Complaint  Patient presents with  . Establish Care    states that she has been having heaviness in her legs and tingling in her feet, x2 months    HPI:  Sarah Hudson is a 49 y.o. female with a PMH of blood clots who presents today for an office visit to establish care.   1.) Heaviness of legs - Associated symptom of heaviness and tingling located in her bilateral lower extremities has been going on for about 2 months. Described as annoying. Modifying factors include moving around which does help with her symptoms. Reports that she has had a blood clot recently and completed a course of Xarelto. Modifying factors include massage by her husband which does seem to help.   No Known Allergies   Outpatient Prescriptions Prior to Visit  Medication Sig Dispense Refill  . XARELTO STARTER PACK 15 & 20 MG TBPK Take 15-20 mg by mouth as directed. Take as directed on package: Start with one 15mg  tablet by mouth twice a day with food. On Day 22, switch to one 20mg  tablet once a day with food. 51 each 0   No facility-administered medications prior to visit.     Past Medical History  Diagnosis Date  . Hx of blood clots   . Chicken pox      Past Surgical History  Procedure Laterality Date  . Umbilical hernia repair    . Tubal ligation       Family History  Problem Relation Age of Onset  . Arthritis Mother   . Hypertension Mother   . Diabetes Mother   . Hypertension Father   . Heart attack Maternal Grandfather     50's     Social History   Social History  . Marital Status: Married    Spouse Name: N/A  . Number of Children: 7  . Years of Education: 16   Occupational History  . Accountant    Social History Main Topics  . Smoking status: Never Smoker   . Smokeless tobacco: Never Used  . Alcohol Use: Yes     Comment: Occasional  . Drug Use: No  . Sexual Activity: Not  on file   Other Topics Concern  . Not on file   Social History Narrative   Fun: Travel   Denies religious beliefs effecting health care   Feels safe at home and denies abuse    Review of Systems  Constitutional: Negative for fever and chills.  Respiratory: Negative for chest tightness and shortness of breath.   Neurological: Positive for numbness. Negative for weakness.      Objective:    BP 132/78 mmHg  Pulse 78  Temp(Src) 97.7 F (36.5 C) (Oral)  Resp 18  Ht 5' 4.5" (1.638 m)  Wt 225 lb (102.059 kg)  BMI 38.04 kg/m2  SpO2 99% Nursing note and vital signs reviewed.  Physical Exam  Constitutional: She is oriented to person, place, and time. She appears well-developed and well-nourished. No distress.  Cardiovascular: Normal rate, regular rhythm, normal heart sounds and intact distal pulses.   Pulmonary/Chest: Effort normal and breath sounds normal.  Musculoskeletal:  Bilateral legs - no obvious deformity, discoloration, or edema noted. No tenderness able to be elicited. Knee and ankle range of motion are intact and appropriate. Mild decrease in sensation noted on the plantar aspect of bilateral feet. Negative  Homans sign. Dorsalis pedis and posterior tibial pulses intact and appropriate.  Neurological: She is alert and oriented to person, place, and time.  Skin: Skin is warm and dry.  Psychiatric: She has a normal mood and affect. Her behavior is normal. Judgment and thought content normal.       Assessment & Plan:   Problem List Items Addressed This Visit      Other   Numbness of feet - Primary    Numbness and tingling/neuropathy of undetermined origin. Exam is benign with some decreased sensation on the plantar surface of her feet. No evidence of calf pain or Homans sign for concern for DVT. Obtain TSH, ABC panel, ferritin, A1c, CBC, basic metabolic panel, and H15 to rule out metabolic causes. Cannot rule out underlying cardiovascular disease or decreased circulation.  Continue conservative treatment at this time with heat, stretching, and massage as needed. Follow-up pending lab work.      Relevant Orders   Basic Metabolic Panel (BMET)   Hemoglobin A1c   CBC   IBC panel   Ferritin   B12   TSH

## 2014-12-13 NOTE — Progress Notes (Signed)
Pre visit review using our clinic review tool, if applicable. No additional management support is needed unless otherwise documented below in the visit note. 

## 2014-12-13 NOTE — Assessment & Plan Note (Signed)
Numbness and tingling/neuropathy of undetermined origin. Exam is benign with some decreased sensation on the plantar surface of her feet. No evidence of calf pain or Homans sign for concern for DVT. Obtain TSH, ABC panel, ferritin, A1c, CBC, basic metabolic panel, and I78 to rule out metabolic causes. Cannot rule out underlying cardiovascular disease or decreased circulation. Continue conservative treatment at this time with heat, stretching, and massage as needed. Follow-up pending lab work.

## 2015-01-05 ENCOUNTER — Observation Stay (HOSPITAL_COMMUNITY)
Admission: EM | Admit: 2015-01-05 | Discharge: 2015-01-06 | Disposition: A | Discharge status: Home / Self Care | Payer: BLUE CROSS/BLUE SHIELD | Attending: Internal Medicine | Admitting: Internal Medicine

## 2015-01-05 ENCOUNTER — Emergency Department (HOSPITAL_BASED_OUTPATIENT_CLINIC_OR_DEPARTMENT_OTHER)
Admit: 2015-01-05 | Discharge: 2015-01-05 | Disposition: A | Discharge status: Home / Self Care | Payer: BLUE CROSS/BLUE SHIELD

## 2015-01-05 ENCOUNTER — Emergency Department (HOSPITAL_COMMUNITY): Payer: BLUE CROSS/BLUE SHIELD

## 2015-01-05 ENCOUNTER — Telehealth: Payer: Self-pay | Admitting: Family

## 2015-01-05 ENCOUNTER — Encounter (HOSPITAL_COMMUNITY): Payer: Self-pay | Admitting: Nurse Practitioner

## 2015-01-05 DIAGNOSIS — I824Z2 Acute embolism and thrombosis of unspecified deep veins of left distal lower extremity: Secondary | ICD-10-CM | POA: Insufficient documentation

## 2015-01-05 DIAGNOSIS — Z86711 Personal history of pulmonary embolism: Secondary | ICD-10-CM | POA: Diagnosis present

## 2015-01-05 DIAGNOSIS — D509 Iron deficiency anemia, unspecified: Secondary | ICD-10-CM | POA: Diagnosis not present

## 2015-01-05 DIAGNOSIS — M79609 Pain in unspecified limb: Secondary | ICD-10-CM

## 2015-01-05 DIAGNOSIS — I2699 Other pulmonary embolism without acute cor pulmonale: Secondary | ICD-10-CM | POA: Diagnosis not present

## 2015-01-05 DIAGNOSIS — I2609 Other pulmonary embolism with acute cor pulmonale: Principal | ICD-10-CM | POA: Insufficient documentation

## 2015-01-05 DIAGNOSIS — I82402 Acute embolism and thrombosis of unspecified deep veins of left lower extremity: Secondary | ICD-10-CM | POA: Diagnosis not present

## 2015-01-05 DIAGNOSIS — Z86718 Personal history of other venous thrombosis and embolism: Secondary | ICD-10-CM | POA: Diagnosis not present

## 2015-01-05 DIAGNOSIS — I82409 Acute embolism and thrombosis of unspecified deep veins of unspecified lower extremity: Secondary | ICD-10-CM

## 2015-01-05 DIAGNOSIS — R0789 Other chest pain: Secondary | ICD-10-CM | POA: Diagnosis present

## 2015-01-05 HISTORY — DX: Anemia, unspecified: D64.9

## 2015-01-05 LAB — CBC
HCT: 35.1 % — ABNORMAL LOW (ref 36.0–46.0)
HEMOGLOBIN: 10.8 g/dL — AB (ref 12.0–15.0)
MCH: 25.3 pg — AB (ref 26.0–34.0)
MCHC: 30.8 g/dL (ref 30.0–36.0)
MCV: 82.2 fL (ref 78.0–100.0)
Platelets: 287 10*3/uL (ref 150–400)
RBC: 4.27 MIL/uL (ref 3.87–5.11)
RDW: 20.5 % — AB (ref 11.5–15.5)
WBC: 5.4 10*3/uL (ref 4.0–10.5)

## 2015-01-05 LAB — BASIC METABOLIC PANEL
ANION GAP: 8 (ref 5–15)
BUN: 5 mg/dL — AB (ref 6–20)
CALCIUM: 9.1 mg/dL (ref 8.9–10.3)
CO2: 26 mmol/L (ref 22–32)
CREATININE: 0.82 mg/dL (ref 0.44–1.00)
Chloride: 107 mmol/L (ref 101–111)
GFR calc Af Amer: >60 mL/min (ref >60)
GLUCOSE: 140 mg/dL — AB (ref 65–99)
Potassium: 3.8 mmol/L (ref 3.5–5.1)
Sodium: 141 mmol/L (ref 135–145)

## 2015-01-05 LAB — I-STAT BETA HCG BLOOD, ED (MC, WL, AP ONLY): I-stat hCG, quantitative: <5 m[IU]/mL (ref <5)

## 2015-01-05 LAB — I-STAT TROPONIN, ED: TROPONIN I, POC: 0 ng/mL (ref 0.00–0.08)

## 2015-01-05 MED ORDER — ONDANSETRON HCL 4 MG PO TABS: 4.0000 mg | ORAL_TABLET | Freq: Four times a day (QID) | ORAL | Status: DC | PRN

## 2015-01-05 MED ORDER — ACETAMINOPHEN 325 MG PO TABS
650.0000 mg | ORAL_TABLET | Freq: Four times a day (QID) | ORAL | Status: DC | PRN
  Administered 2015-01-05: 650 mg via ORAL
  Filled 2015-01-05: qty 2

## 2015-01-05 MED ORDER — RIVAROXABAN (XARELTO) EDUCATION KIT FOR DVT/PE PATIENTS
PACK | Freq: Once | Status: DC
  Filled 2015-01-05: qty 1

## 2015-01-05 MED ORDER — RIVAROXABAN 20 MG PO TABS: 20.0000 mg | ORAL_TABLET | Freq: Every day | ORAL | Status: DC

## 2015-01-05 MED ORDER — RIVAROXABAN 15 MG PO TABS
15.0000 mg | ORAL_TABLET | Freq: Two times a day (BID) | ORAL | Status: DC
  Administered 2015-01-06: 15 mg via ORAL
  Filled 2015-01-05: qty 1

## 2015-01-05 MED ORDER — ENOXAPARIN SODIUM 100 MG/ML ~~LOC~~ SOLN
1.0000 mg/kg | Freq: Two times a day (BID) | SUBCUTANEOUS | Status: DC
  Administered 2015-01-05: 100 mg via SUBCUTANEOUS
  Filled 2015-01-05 (×2): qty 1

## 2015-01-05 MED ORDER — ONDANSETRON HCL 4 MG/2ML IJ SOLN: 4.0000 mg | Freq: Four times a day (QID) | INTRAMUSCULAR | Status: DC | PRN

## 2015-01-05 MED ORDER — ACETAMINOPHEN 650 MG RE SUPP: 650.0000 mg | Freq: Four times a day (QID) | RECTAL | Status: DC | PRN

## 2015-01-05 MED ORDER — IOHEXOL 350 MG/ML SOLN
80.0000 mL | Freq: Once | INTRAVENOUS | Status: AC | PRN
  Administered 2015-01-05: 80 mL via INTRAVENOUS

## 2015-01-05 MED ORDER — ALUM & MAG HYDROXIDE-SIMETH 200-200-20 MG/5ML PO SUSP: 30.0000 mL | Freq: Four times a day (QID) | ORAL | Status: DC | PRN

## 2015-01-05 NOTE — Progress Notes (Addendum)
ANTICOAGULATION CONSULT NOTE - Initial Consult  Pharmacy Consult for Lovenox transition to Xarelto Indication: pulmonary embolus and DVT  No Known Allergies  Patient Measurements: Height: 5\' 4"  (162.6 cm) Weight: 225 lb (102.059 kg) IBW/kg (Calculated) : 54.7 Heparin Dosing Weight:   Vital Signs: Temp: 98.3 F (36.8 C) (10/07 1336) Temp Source: Oral (10/07 1336) BP: 148/74 mmHg (10/07 1445) Pulse Rate: 79 (10/07 1445)  Labs:  Recent Labs  01/05/15 1345  HGB 10.8*  HCT 35.1*  PLT 287  CREATININE 0.82    Estimated Creatinine Clearance: 98.7 mL/min (by C-G formula based on Cr of 0.82).   Medical History: Past Medical History  Diagnosis Date  . Hx of blood clots   . Chicken pox     Medications:   (Not in a hospital admission) Scheduled:  Infusions:   Assessment: 49yo female with history of DVT presents with b/l LE cramping and discomfort followed by chest tightness and discomfort. Pharmacy is consulted to dose lovenox for DVT found on venous duplex in L peroneal vein. Hgb 10.8, Plt 287, sCr 0.82.  Goal of Therapy:  Monitor platelets by anticoagulation protocol: Yes   Plan:  Lovenox 1mg /kg subcutaneously q12h Daily CBC Monitor s/sx of bleeding  Andrey Cota. Diona Foley, PharmD Clinical Pharmacist Pager [phone] 01/05/2015,4:42 PM  ADDN: Pharmacy is consulted to transition patient from lovenox to [place] for DVT/PE. Pt received lovenox 10/7 at 1700.  Plan: D/c lovenox Start xarelto 15mg  PO BID for 42 doses followed by 20mg  daily Educate pt on xarelto  Pauline Pegues M. Diona Foley, PharmD Clinical Pharmacist Pager [phone]

## 2015-01-05 NOTE — ED Notes (Signed)
She c/o numbness/tingling to BLE 2 weeks ago. She was seen by PCP and they started her on iron for anemia at that time. Yesterday she began to have painful sensation in L calf, like when she had a DVT, and tightness in her chest. Her PCP instructed her to come for evaluation of PE

## 2015-01-05 NOTE — H&P (Addendum)
[facility] History and Physical  Sarah Hudson IFO:[id number] DOB: 11-10-1965 DOA: 01/05/2015   PCP: Mauricio Po, FNP    Chief Complaint: chest pressure  HPI: Sarah Hudson is a 49 y.o. female with history of left leg DVT in February of this year. She states that it occurred after a road trip to and from [place]. She was placed on Xarelto and took it for 2 months but stopped taking it because she was having excessive bleeding during her periods.  She presents to the hospital today with a dull pain in her chest which started this morning. She has mild shortness of breath and a dry cough as well. She is also had some pain in her left calf. Doppler of lower extremities reveals an acute left leg DVT and a CT of the chest reveals pulmonary emboli.  General: The patient denies anorexia, fever, weight loss Cardiac: Denies chest pain, syncope, palpitations, pedal edema  Respiratory: + dry cough, shortness of breath, no wheezing GI: Denies severe indigestion/heartburn, abdominal pain, vomiting, diarrhea and constipation + nausea today GU: Denies hematuria, incontinence, dysuria  Musculoskeletal: muscle aches for a few weeks Skin: Denies suspicious skin lesions Neurologic: Denies focal weakness change in vision + tingling in bottom of feet Psychiatry: Denies depression or anxiety. Hematologic: no easy bruising or bleeding  All other systems reviewed and found to be negative.  Past Medical History  Diagnosis Date  . Hx of blood clots   . Chicken pox     Past Surgical History  Procedure Laterality Date  . Umbilical hernia repair    . Tubal ligation      Social History: does not smoke or drink alcohol Lives at home with husband    No Known Allergies  Family history:   Family History  Problem Relation Age of Onset  . Arthritis Mother   . Hypertension Mother   . Diabetes Mother   . Hypertension Father   . Heart attack Maternal Grandfather     50's   history of "blood  clot" in her father    Prior to Admission medications   Medication Sig Start Date End Date Taking? Authorizing Provider  Fe Cbn-Fe Gluc-FA-B12-C-DSS (FERRALET 90) 90-1 MG TABS Take 1 tablet by mouth daily. 12/13/14  Yes [place], FNP     Physical Exam: Filed Vitals:   01/05/15 1336 01/05/15 1445 01/05/15 1658 01/05/15 1730  BP: 153/73 148/74 122/73 137/77  Pulse: 86 79 66 72  Temp: 98.3 F (36.8 C)     TempSrc: Oral     Resp: 16 21 18 16   Height: 5\' 4"  (1.626 m)     Weight: 102.059 kg (225 lb)     SpO2: 100% 100% 99% 98%     General: AAO x 3, no acute distress HEENT: Normocephalic and Atraumatic, Mucous membranes pink                PERRLA; EOM intact; No scleral icterus,                 Nares: Patent, Oropharynx: Clear, Fair Dentition                 Neck: FROM, no cervical lymphadenopathy, thyromegaly, carotid bruit or JVD;  Breasts: deferred CHEST WALL: No tenderness  CHEST: Normal respiration, clear to auscultation bilaterally  HEART: Regular rate and rhythm; no murmurs rubs or gallops  BACK: No kyphosis or scoliosis; no CVA tenderness  GI: Positive Bowel Sounds, soft, non-tender; no masses,  no organomegaly Rectal Exam: deferred MSK: No cyanosis, clubbing, or edema Genitalia: not examined  SKIN:  no rash or ulceration  CNS: Alert and Oriented x 4, Nonfocal exam, CN 2-12 intact  Labs on Admission:  Basic Metabolic Panel:  Recent Labs Lab 01/05/15 1345  NA 141  K 3.8  CL 107  CO2 26  GLUCOSE 140*  BUN 5*  CREATININE 0.82  CALCIUM 9.1   Liver Function Tests: No results for input(s): AST, ALT, ALKPHOS, BILITOT, PROT, ALBUMIN in the last 168 hours. No results for input(s): LIPASE, AMYLASE in the last 168 hours. No results for input(s): AMMONIA in the last 168 hours. CBC:  Recent Labs Lab 01/05/15 1345  WBC 5.4  HGB 10.8*  HCT 35.1*  MCV 82.2  PLT 287   Cardiac Enzymes: No results for input(s): CKTOTAL, CKMB, CKMBINDEX, TROPONINI in the  last 168 hours.  BNP (last 3 results) No results for input(s): BNP in the last 8760 hours.  ProBNP (last 3 results) No results for input(s): PROBNP in the last 8760 hours.  CBG: No results for input(s): GLUCAP in the last 168 hours.  Radiological Exams on Admission: Dg Chest 2 View  01/05/2015   CLINICAL DATA:  Chest pain  EXAM: CHEST  2 VIEW  COMPARISON:  08/06/2008  FINDINGS: 2.0 cm nodular density has developed at the left apex laterally. Lungs are otherwise clear. No pneumothorax. No pleural effusion. Normal heart size.  IMPRESSION: New left upper lobe nodular density. Pulmonary nodule is not excluded. CT can be performed to further evaluate.   Electronically Signed   By: Marybelle Killings M.D.   On: 01/05/2015 13:58   Ct Angio Chest Pe W/cm &/or Wo Cm  01/05/2015   CLINICAL DATA:  Mid chest pain of recent onset beginning yesterday. DVT of the left lower extremity.  EXAM: CT ANGIOGRAPHY CHEST WITH CONTRAST  TECHNIQUE: Multidetector CT imaging of the chest was performed using the standard protocol during bolus administration of intravenous contrast. Multiplanar CT image reconstructions and MIPs were obtained to evaluate the vascular anatomy.  CONTRAST:  52mL OMNIPAQUE IOHEXOL 350 MG/ML SOLN  COMPARISON:  Radiography same day  FINDINGS: Pulmonary arterial opacification is good. Small volume pulmonary embolism noted in the right upper lobe pulmonary arterial tree. Small volume pulmonary embolism noted in the right lower lobe pulmonary arterial tree. No emboli demonstrated on the left.  The aorta and its branch vessels are normal. The lungs are clear. No effusions. Scans in the upper abdomen are normal. No bony abnormality. No mediastinal mass or lymphadenopathy.  Review of the MIP images confirms the above findings.  IMPRESSION: The study is positive for small volume pulmonary emboli in the right upper lobe and right lower lobe.  These results were called by telephone at the time of interpretation on  01/05/2015 at 4:23 pm to Dr. Quintella Reichert , who verbally acknowledged these results.   Electronically Signed   By: Nelson Chimes M.D.   On: 01/05/2015 16:25    EKG: Independently reviewed. Normal sinus rhythm  Assessment/Plan Active Problems:   Pulmonary embolism right upper and right lower lobe - DVT (deep venous thrombosis) left leg -Initial DVT in February was provoked due to a long car ride but she does admit that her father had a "blood clot" -She's been given Lovenox in the ER-will transition to Xarelto -She is not requiring any oxygen at this time -She states that pain is not severe enough that she needs any pain medication at this  time -Have discussed with her in detail that she will continue to have heavy periods while she is on this medication and that due to risk of life-threatening PE, she should not discontinue the medication-she and her husband appear to understand and agrees with the treatment plan    Iron (Fe) deficiency anemia -I have told her that she should discuss increasing her iron supplement to twice a day or 3 times a day while she is having increased blood loss during menses secondary to Xarelto   Consulted:   Code Status: Full code  Family Communication: Husband  DVT Prophylaxis:Lovenox/Xarelto  Time spent: 3 min [place], MD [facility]  If 7PM-7AM, please contact night-coverage [email or web address] 01/05/2015, 5:38 PM

## 2015-01-05 NOTE — Telephone Encounter (Signed)
Called pt back. Told her that I spoke with Dr. Linna Darner since Marya Amsler was not here today and he stated that it is best that she go to the ER due to her hx of blood clots and the [place] could take a couple of days until it gets done. It also might not be able to detect blood clots in her lungs. To be on the safe side due to the chest discomfort as well advised pt go to the ER. Pt understood.

## 2015-01-05 NOTE — Telephone Encounter (Signed)
Pt was in a few weeks ago with leg pain and everything went well, but she is experiencing sharp pains in her calf with some chest discomfort. She has had a blood clot before so she is concerned that it may be. She is requesting an order for an ultra sound to be sure. Please advise pt

## 2015-01-05 NOTE — ED Provider Notes (Signed)
CSN: [id number]     Arrival date & time 01/05/15  1316 History   First MD Initiated Contact with Patient 01/05/15 1458     Chief Complaint  Patient presents with  . Chest Pain     Patient is a 49 y.o. female presenting with chest pain. The history is provided by the patient. No language interpreter was used.  Chest Pain  Ms. Aslin and for evaluation of chest pain. She has a history of left lower extremity DVT 6 months ago that was treated with a relative and she is currently off treatment. She's had some cramping and discomfort and bilateral lower extremities for the last 2 weeks with some numbness over the basis of her feet. Yesterday she had a "charley horse in her left calf that was similar to her prior DVT symptoms. This morning after she woke up she developed some central chest tightness that is described as tightness and discomfort. It is worse with deep breaths. She feels mildly intermittently short of breath. She has experienced night sweats for the last week. No fevers, cough, abdominal pain, vomiting. Symptoms are moderate and constant.  Past Medical History  Diagnosis Date  . Hx of blood clots   . Chicken pox    Past Surgical History  Procedure Laterality Date  . Umbilical hernia repair    . Tubal ligation     Family History  Problem Relation Age of Onset  . Arthritis Mother   . Hypertension Mother   . Diabetes Mother   . Hypertension Father   . Heart attack Maternal Grandfather     50's   Social History  Substance Use Topics  . Smoking status: Never Smoker   . Smokeless tobacco: Never Used  . Alcohol Use: Yes     Comment: Occasional   OB History    No data available     Review of Systems  Cardiovascular: Positive for chest pain.  All other systems reviewed and are negative.     Allergies  Review of patient's allergies indicates no known allergies.  Home Medications   Prior to Admission medications   Medication Sig Start Date End Date Taking?  Authorizing Provider  Fe Cbn-Fe Gluc-FA-B12-C-DSS (FERRALET 90) 90-1 MG TABS Take 1 tablet by mouth daily. 12/13/14   Golden Circle, FNP   BP 148/74 mmHg  Pulse 79  Temp(Src) 98.3 F (36.8 C) (Oral)  Resp 21  Ht 5\' 4"  (1.626 m)  Wt 225 lb (102.059 kg)  BMI 38.60 kg/m2  SpO2 100%  LMP 12/29/2014 Physical Exam  Constitutional: She is oriented to person, place, and time. She appears well-developed and well-nourished.  HENT:  Head: Normocephalic and atraumatic.  Cardiovascular: Normal rate and regular rhythm.   No murmur heard. Pulmonary/Chest: Effort normal and breath sounds normal. No respiratory distress.  Abdominal: Soft. There is no tenderness. There is no rebound and no guarding.  Musculoskeletal: She exhibits no edema or tenderness.  2+ DP pulses bilaterally.  Neurological: She is alert and oriented to person, place, and time.  Skin: Skin is warm and dry.  Psychiatric: She has a normal mood and affect. Her behavior is normal.  Nursing note and vitals reviewed.   ED Course  Procedures (including critical care time) Labs Review Labs Reviewed  BASIC METABOLIC PANEL - Abnormal; Notable for the following:    Glucose, Bld 140 (*)    BUN 5 (*)    All other components within normal limits  CBC - Abnormal; Notable for the  following:    Hemoglobin 10.8 (*)    HCT 35.1 (*)    MCH 25.3 (*)    RDW 20.5 (*)    All other components within normal limits  I-STAT TROPOININ, ED  I-STAT BETA HCG BLOOD, ED ([facility], [facility], [facility] ONLY)    Imaging Review Dg Chest 2 View  01/05/2015   CLINICAL DATA:  Chest pain  EXAM: CHEST  2 VIEW  COMPARISON:  08/06/2008  FINDINGS: 2.0 cm nodular density has developed at the left apex laterally. Lungs are otherwise clear. No pneumothorax. No pleural effusion. Normal heart size.  IMPRESSION: New left upper lobe nodular density. Pulmonary nodule is not excluded. CT can be performed to further evaluate.   Electronically Signed   By: Marybelle Killings M.D.   On:  01/05/2015 13:58   I have personally reviewed and evaluated these images and lab results as part of my medical decision-making.   EKG Interpretation   Date/Time:  Friday January 05 2015 13:32:49 EDT Ventricular Rate:  85 PR Interval:  138 QRS Duration: 74 QT Interval:  364 QTC Calculation: 433 R Axis:   52 Text Interpretation:  Normal sinus rhythm Normal ECG Confirmed by Hazle Coca [phone]) on 01/05/2015 2:57:22 PM      MDM   Final diagnoses:  Other acute pulmonary embolism with acute cor pulmonale (HCC)    Pt with hx/o DVT currently off anticoagulation here with CP, SOB, leg pain.  US demonstrates peroneal DVT.  CT scan with small emboli.  Pt with hx/o xarelto use but had significant vaginal hemorrhage, has a chronic anemia.  Pt treated with lovenox in the ED with medicine consultation for observation.     Quintella Reichert, MD 01/06/15 [phone]

## 2015-01-05 NOTE — Progress Notes (Signed)
VASCULAR LAB PRELIMINARY  PRELIMINARY  PRELIMINARY  PRELIMINARY  Bilateral lower extremity venous duplex completed.    Preliminary report:  There is acute DVT in the left peroneal vein.  All other veins appear thrombus free.   Laurenashley Viar, RVT 01/05/2015, 3:43 PM

## 2015-01-06 DIAGNOSIS — D509 Iron deficiency anemia, unspecified: Secondary | ICD-10-CM | POA: Diagnosis not present

## 2015-01-06 DIAGNOSIS — I2699 Other pulmonary embolism without acute cor pulmonale: Secondary | ICD-10-CM | POA: Diagnosis not present

## 2015-01-06 DIAGNOSIS — I82402 Acute embolism and thrombosis of unspecified deep veins of left lower extremity: Secondary | ICD-10-CM | POA: Diagnosis not present

## 2015-01-06 LAB — BASIC METABOLIC PANEL
Anion gap: 9 (ref 5–15)
BUN: 6 mg/dL (ref 6–20)
CHLORIDE: 106 mmol/L (ref 101–111)
CO2: 25 mmol/L (ref 22–32)
Calcium: 8.8 mg/dL — ABNORMAL LOW (ref 8.9–10.3)
Creatinine, Ser: 0.85 mg/dL (ref 0.44–1.00)
GFR calc Af Amer: >60 mL/min (ref >60)
GFR calc non Af Amer: >60 mL/min (ref >60)
GLUCOSE: 138 mg/dL — AB (ref 65–99)
POTASSIUM: 3.6 mmol/L (ref 3.5–5.1)
Sodium: 140 mmol/L (ref 135–145)

## 2015-01-06 LAB — CBC
HEMATOCRIT: 33.5 % — AB (ref 36.0–46.0)
Hemoglobin: 10.4 g/dL — ABNORMAL LOW (ref 12.0–15.0)
MCH: 25.8 pg — AB (ref 26.0–34.0)
MCHC: 31 g/dL (ref 30.0–36.0)
MCV: 83.1 fL (ref 78.0–100.0)
Platelets: 252 10*3/uL (ref 150–400)
RBC: 4.03 MIL/uL (ref 3.87–5.11)
RDW: 20.9 % — AB (ref 11.5–15.5)
WBC: 5 10*3/uL (ref 4.0–10.5)

## 2015-01-06 MED ORDER — RIVAROXABAN (XARELTO) VTE STARTER PACK (15 & 20 MG): ORAL_TABLET | ORAL | Status: DC

## 2015-01-06 NOTE — Discharge Summary (Signed)
Physician Discharge Summary  Sarah Hudson DVV:[id number] DOB: Jan 24, 1966 DOA: 01/05/2015  PCP: Mauricio Po, FNP  Admit date: 01/05/2015 Discharge date: 01/06/2015  Time spent: 60 minutes    Discharge Condition: stable   Discharge Diagnoses:  Active Problems:   Pulmonary embolism (HCC)   DVT (deep venous thrombosis) (HCC)   Iron (Fe) deficiency anemia   History of present illness:  Sarah Hudson is a 49 y.o. female with history of left leg DVT in February of this year. She states that it occurred after a road trip to and from [place]. She was placed on Xarelto and took it for 2 months but stopped taking it because she was having excessive bleeding during her periods.  She presents to the hospital today with a dull pain in her chest which started this morning. She has mild shortness of breath and a dry cough as well. She is also had some transient pain in her left calf but no swelling. Doppler of lower extremities reveals an acute left leg DVT and a CT of the chest reveals pulmonary emboli.   Hospital Course:  Pulmonary embolism right upper and right lower lobe - DVT (deep venous thrombosis) left leg -Initial DVT in February was provoked due to a long car ride but she does admit that her father had a "blood clot" -She was egiven Lovenox in the ER-  transitioned to Xarelto -She is not requiring any oxygen  -She states that pain is not severe enough that she needs any pain medication   -Have discussed with her in detail that she will continue to have heavy periods while she is on this medication and that due to risk of life-threatening PE, she should not discontinue the medication-she and her husband appear to understand and agree with the treatment plan   Iron (Fe) deficiency anemia -I have told her that she should discuss increasing her iron supplement to twice a day or 3 times a day with her PCP while she is having increased blood loss during menses secondary to  Xarelto  Discharge Exam: Filed Weights   01/05/15 1336  Weight: 102.059 kg (225 lb)   Filed Vitals:   01/06/15 0524  BP: 115/55  Pulse: 72  Temp: 98.3 F (36.8 C)  Resp: 17    General: AAO x 3, no distress Cardiovascular: RRR, no murmurs  Respiratory: clear to auscultation bilaterally GI: soft, non-tender, non-distended, bowel sound positive  Discharge Instructions You were cared for by a hospitalist during your hospital stay. If you have any questions about your discharge medications or the care you received while you were in the hospital after you are discharged, you can call the unit and asked to speak with the hospitalist on call if the hospitalist that took care of you is not available. Once you are discharged, your primary care physician will handle any further medical issues. Please note that NO REFILLS for any discharge medications will be authorized once you are discharged, as it is imperative that you return to your primary care physician (or establish a relationship with a primary care physician if you do not have one) for your aftercare needs so that they can reassess your need for medications and monitor your lab values.  Discharge Instructions    Diet - low sodium heart healthy    Complete by:  As directed      Increase activity slowly    Complete by:  As directed  Medication List    STOP taking these medications        aspirin 81 MG chewable tablet      TAKE these medications        FERRALET 90 90-1 MG Tabs  Take 1 tablet by mouth daily.     Rivaroxaban 15 & 20 MG Tbpk  Commonly known as:  XARELTO STARTER PACK  Take as directed on package: Start with one 15mg  tablet by mouth twice a day with food. On Day 22, switch to one 20mg  tablet once a day with food.       No Known Allergies    The results of significant diagnostics from this hospitalization (including imaging, microbiology, ancillary and laboratory) are listed below for reference.     Significant Diagnostic Studies: Dg Chest 2 View  01/05/2015   CLINICAL DATA:  Chest pain  EXAM: CHEST  2 VIEW  COMPARISON:  08/06/2008  FINDINGS: 2.0 cm nodular density has developed at the left apex laterally. Lungs are otherwise clear. No pneumothorax. No pleural effusion. Normal heart size.  IMPRESSION: New left upper lobe nodular density. Pulmonary nodule is not excluded. CT can be performed to further evaluate.   Electronically Signed   By: Marybelle Killings M.D.   On: 01/05/2015 13:58   Ct Angio Chest Pe W/cm &/or Wo Cm  01/05/2015   CLINICAL DATA:  Mid chest pain of recent onset beginning yesterday. DVT of the left lower extremity.  EXAM: CT ANGIOGRAPHY CHEST WITH CONTRAST  TECHNIQUE: Multidetector CT imaging of the chest was performed using the standard protocol during bolus administration of intravenous contrast. Multiplanar CT image reconstructions and MIPs were obtained to evaluate the vascular anatomy.  CONTRAST:  28mL OMNIPAQUE IOHEXOL 350 MG/ML SOLN  COMPARISON:  Radiography same day  FINDINGS: Pulmonary arterial opacification is good. Small volume pulmonary embolism noted in the right upper lobe pulmonary arterial tree. Small volume pulmonary embolism noted in the right lower lobe pulmonary arterial tree. No emboli demonstrated on the left.  The aorta and its branch vessels are normal. The lungs are clear. No effusions. Scans in the upper abdomen are normal. No bony abnormality. No mediastinal mass or lymphadenopathy.  Review of the MIP images confirms the above findings.  IMPRESSION: The study is positive for small volume pulmonary emboli in the right upper lobe and right lower lobe.  These results were called by telephone at the time of interpretation on 01/05/2015 at 4:23 pm to Dr. Quintella Reichert , who verbally acknowledged these results.   Electronically Signed   By: Nelson Chimes M.D.   On: 01/05/2015 16:25    Microbiology: No results found for this or any previous visit (from the past 240  hour(s)).   Labs: Basic Metabolic Panel:  Recent Labs Lab 01/05/15 1345 01/06/15 0520  NA 141 140  K 3.8 3.6  CL 107 106  CO2 26 25  GLUCOSE 140* 138*  BUN 5* 6  CREATININE 0.82 0.85  CALCIUM 9.1 8.8*   Liver Function Tests: No results for input(s): AST, ALT, ALKPHOS, BILITOT, PROT, ALBUMIN in the last 168 hours. No results for input(s): LIPASE, AMYLASE in the last 168 hours. No results for input(s): AMMONIA in the last 168 hours. CBC:  Recent Labs Lab 01/05/15 1345 01/06/15 0520  WBC 5.4 5.0  HGB 10.8* 10.4*  HCT 35.1* 33.5*  MCV 82.2 83.1  PLT 287 252   Cardiac Enzymes: No results for input(s): CKTOTAL, CKMB, CKMBINDEX, TROPONINI in the last 168 hours. BNP:  BNP (last 3 results) No results for input(s): BNP in the last 8760 hours.  ProBNP (last 3 results) No results for input(s): PROBNP in the last 8760 hours.  CBG: No results for input(s): GLUCAP in the last 168 hours.     SignedDebbe Odea, MD [facility] 01/06/2015, 9:32 AM

## 2015-01-06 NOTE — Progress Notes (Addendum)
Cm provided Xarelto $0 copay/month coupon as well as 30 day free supply coupon. No further CM needs communicated.  Pt planning to go home today with her husband with no further discharge needs communicated.

## 2015-01-06 NOTE — Progress Notes (Signed)
Patient discharge teaching given, including activity, diet, follow-up appoints, and medications. Patient verbalized understanding of all discharge instructions. Education regarding Xarelto, Pulm embolism, and DVT with DVT prevention provided. IV access was d/c'd. Vitals are stable. Skin is intact except as charted in most recent assessments. Pt to be escorted out by NT, to be driven home by husband.

## 2015-01-26 ENCOUNTER — Encounter: Payer: Self-pay | Admitting: Family

## 2015-01-26 ENCOUNTER — Ambulatory Visit (INDEPENDENT_AMBULATORY_CARE_PROVIDER_SITE_OTHER): Payer: BLUE CROSS/BLUE SHIELD | Admitting: Family

## 2015-01-26 VITALS — BP 130/78 | HR 67 | Temp 98.4°F | Resp 18 | Ht 64.5 in | Wt 230.1 lb

## 2015-01-26 DIAGNOSIS — I82402 Acute embolism and thrombosis of unspecified deep veins of left lower extremity: Secondary | ICD-10-CM | POA: Diagnosis not present

## 2015-01-26 DIAGNOSIS — D509 Iron deficiency anemia, unspecified: Secondary | ICD-10-CM | POA: Diagnosis not present

## 2015-01-26 DIAGNOSIS — I2699 Other pulmonary embolism without acute cor pulmonale: Secondary | ICD-10-CM

## 2015-01-26 MED ORDER — RIVAROXABAN 20 MG PO TABS: 20.0000 mg | ORAL_TABLET | Freq: Every day | ORAL | Status: DC

## 2015-01-26 NOTE — Progress Notes (Signed)
Pre visit review using our clinic review tool, if applicable. No additional management support is needed unless otherwise documented below in the visit note. 

## 2015-01-26 NOTE — Progress Notes (Signed)
Subjective:    Patient ID: Sarah Hudson, female    DOB: [birth year], 49 y.o.   MRN: [id number]  Chief Complaint  Patient presents with  . Follow-up    she had a pain in her calf and had a blood clot, the clot never went away and got in her lungs, she is back on xarelto     HPI:  Sarah Hudson is a 49 y.o. female who  has a past medical history of blood clots; Chicken pox; and Anemia. and presents today for a hospital follow up.  1.) DVT/PE - recently seen in the hospital and evaluated for a dull chest pain which started earlier that day as well as mild shortness of breath and a dry cough. Doppler lower extremities reveals an acute left leg DVT and CT reveals pulmonary emboli. She was treated with Lovenox in the ED and transition to Xarelto. Significant previous history of a DVT in February complaint sounds are alto which was discontinued secondary to excessive bleeding during her menstrual cycle. She was also noted to have iron deficiency anemia. It was recommended for her to increase her iron supplementation twice a day and follow up with primary care while on Xarelto. All hospital records were reviewed in detail.   Since leaving the hospital she continues to experience some tingling in her feet, however she does not have any chest pain or shortness of breath.Taking her increased iron as prescribed and denies adverse side effects. She has restarted the Xarelto starter pack and takes the medication as prescribed and denies adverse side effects or nuisance bleeding.   No Known Allergies   Current Outpatient Prescriptions on File Prior to Visit  Medication Sig Dispense Refill  . Fe Cbn-Fe Gluc-FA-B12-C-DSS (FERRALET 90) 90-1 MG TABS Take 1 tablet by mouth daily. 30 each 2   No current facility-administered medications on file prior to visit.     Review of Systems  Constitutional: Negative for fever and chills.  Respiratory: Negative for chest tightness and shortness of breath.     Cardiovascular: Negative for chest pain, palpitations and leg swelling.  Neurological: Negative for headaches.      Objective:    BP 130/78 mmHg  Pulse 67  Temp(Src) 98.4 F (36.9 C) (Oral)  Resp 18  Ht 5' 4.5" (1.638 m)  Wt 230 lb 1.9 oz (104.382 kg)  BMI 38.90 kg/m2  SpO2 98%  LMP 12/29/2014 Nursing note and vital signs reviewed.  Physical Exam  Constitutional: She is oriented to person, place, and time. She appears well-developed and well-nourished. No distress.  Cardiovascular: Normal rate, regular rhythm, normal heart sounds and intact distal pulses.   Pulmonary/Chest: Effort normal and breath sounds normal.  Musculoskeletal:  No calf pain or edema noted.   Neurological: She is alert and oriented to person, place, and time.  Skin: Skin is warm and dry.  Psychiatric: She has a normal mood and affect. Her behavior is normal. Judgment and thought content normal.       Assessment & Plan:   Problem List Items Addressed This Visit      Cardiovascular and Mediastinum   Pulmonary embolism (HCC)   Relevant Medications   rivaroxaban (XARELTO) 20 MG TABS tablet   DVT (deep venous thrombosis) (HCC) - Primary    Stable with no symptoms on current dose of Xarelto. Denies adverse side effects or nuisance bleeding. Continue current dosage of Xarelto. Anticipate long term anticoagulation. Follow up if symptoms worsen or seek Emergency Care for  uncontrolled bleeding.       Relevant Medications   rivaroxaban (XARELTO) 20 MG TABS tablet     Other   Iron (Fe) deficiency anemia    Symptoms improved with current regimen of Ferralet. Denies adverse side effects. Follow up in 2 months for recheck of CBC and iron. Continue current dosage of Ferralet.

## 2015-01-26 NOTE — Patient Instructions (Signed)
Thank you for choosing [facility].  Summary/Instructions:  Your prescription(s) have been submitted to your pharmacy or been printed and provided for you. Please take as directed and contact our office if you believe you are having problem(s) with the medication(s) or have any questions.  If your symptoms worsen or fail to improve, please contact our office for further instruction, or in case of emergency go directly to the emergency room at the closest medical facility.   Please continue to take your medications as prescribed. Follow up in 2 months.

## 2015-01-26 NOTE — Assessment & Plan Note (Signed)
Stable with no symptoms on current dose of Xarelto. Denies adverse side effects or nuisance bleeding. Continue current dosage of Xarelto. Anticipate long term anticoagulation. Follow up if symptoms worsen or seek Emergency Care for uncontrolled bleeding.

## 2015-01-26 NOTE — Assessment & Plan Note (Addendum)
Symptoms improved with current regimen of Ferralet. Denies adverse side effects. Follow up in 2 months for recheck of CBC and iron. Continue current dosage of Ferralet.

## 2015-05-19 ENCOUNTER — Other Ambulatory Visit: Payer: Self-pay | Admitting: Family

## 2015-07-30 ENCOUNTER — Telehealth: Payer: Self-pay | Admitting: Emergency Medicine

## 2015-07-30 ENCOUNTER — Other Ambulatory Visit (INDEPENDENT_AMBULATORY_CARE_PROVIDER_SITE_OTHER): Payer: BLUE CROSS/BLUE SHIELD

## 2015-07-30 ENCOUNTER — Encounter (HOSPITAL_COMMUNITY): Payer: Self-pay

## 2015-07-30 ENCOUNTER — Encounter: Payer: Self-pay | Admitting: Family

## 2015-07-30 ENCOUNTER — Observation Stay (HOSPITAL_COMMUNITY)
Admission: EM | Admit: 2015-07-30 | Discharge: 2015-08-01 | Disposition: A | Discharge status: Home / Self Care | Payer: BLUE CROSS/BLUE SHIELD | Attending: Internal Medicine | Admitting: Internal Medicine

## 2015-07-30 ENCOUNTER — Ambulatory Visit (INDEPENDENT_AMBULATORY_CARE_PROVIDER_SITE_OTHER): Payer: BLUE CROSS/BLUE SHIELD | Admitting: Family

## 2015-07-30 VITALS — BP 112/64 | HR 70 | Temp 98.2°F | Resp 16 | Ht 64.5 in | Wt 219.0 lb

## 2015-07-30 DIAGNOSIS — Z7901 Long term (current) use of anticoagulants: Secondary | ICD-10-CM

## 2015-07-30 DIAGNOSIS — D649 Anemia, unspecified: Secondary | ICD-10-CM | POA: Diagnosis not present

## 2015-07-30 DIAGNOSIS — N920 Excessive and frequent menstruation with regular cycle: Secondary | ICD-10-CM | POA: Insufficient documentation

## 2015-07-30 DIAGNOSIS — D509 Iron deficiency anemia, unspecified: Secondary | ICD-10-CM

## 2015-07-30 DIAGNOSIS — I2699 Other pulmonary embolism without acute cor pulmonale: Secondary | ICD-10-CM | POA: Diagnosis not present

## 2015-07-30 DIAGNOSIS — I82402 Acute embolism and thrombosis of unspecified deep veins of left lower extremity: Secondary | ICD-10-CM

## 2015-07-30 DIAGNOSIS — I2782 Chronic pulmonary embolism: Secondary | ICD-10-CM | POA: Diagnosis not present

## 2015-07-30 DIAGNOSIS — I82509 Chronic embolism and thrombosis of unspecified deep veins of unspecified lower extremity: Secondary | ICD-10-CM | POA: Diagnosis not present

## 2015-07-30 LAB — CBC
MCHC: 29.5 g/dL — ABNORMAL LOW (ref 30.0–36.0)
Platelets: 324 10*3/uL (ref 150.0–400.0)
RBC: 3.56 Mil/uL — AB (ref 3.87–5.11)
RDW: 22.1 % — ABNORMAL HIGH (ref 11.5–15.5)
WBC: 5.1 10*3/uL (ref 4.0–10.5)

## 2015-07-30 LAB — BASIC METABOLIC PANEL
Anion gap: 8 (ref 5–15)
BUN: 6 mg/dL (ref 6–20)
CO2: 21 mmol/L — AB (ref 22–32)
Calcium: 8.8 mg/dL — ABNORMAL LOW (ref 8.9–10.3)
Chloride: 108 mmol/L (ref 101–111)
Creatinine, Ser: 0.77 mg/dL (ref 0.44–1.00)
GFR calc Af Amer: >60 mL/min (ref >60)
GLUCOSE: 90 mg/dL (ref 65–99)
POTASSIUM: 4 mmol/L (ref 3.5–5.1)
Sodium: 137 mmol/L (ref 135–145)

## 2015-07-30 LAB — CBC WITH DIFFERENTIAL/PLATELET
BASOS ABS: 0.1 10*3/uL (ref 0.0–0.1)
Basophils Relative: 1 %
EOS ABS: 0.1 10*3/uL (ref 0.0–0.7)
Eosinophils Relative: 1 %
HEMATOCRIT: 24 % — AB (ref 36.0–46.0)
Hemoglobin: 6.6 g/dL — CL (ref 12.0–15.0)
LYMPHS ABS: 2.2 10*3/uL (ref 0.7–4.0)
Lymphocytes Relative: 43 %
MCH: 18.6 pg — ABNORMAL LOW (ref 26.0–34.0)
MCHC: 27.5 g/dL — AB (ref 30.0–36.0)
MCV: 67.6 fL — ABNORMAL LOW (ref 78.0–100.0)
MONO ABS: 0.8 10*3/uL (ref 0.1–1.0)
MONOS PCT: 14 %
NEUTROS ABS: 2.2 10*3/uL (ref 1.7–7.7)
Neutrophils Relative %: 41 %
PLATELETS: 293 10*3/uL (ref 150–400)
RBC: 3.55 MIL/uL — ABNORMAL LOW (ref 3.87–5.11)
RDW: 20.2 % — AB (ref 11.5–15.5)
WBC: 5.4 10*3/uL (ref 4.0–10.5)

## 2015-07-30 LAB — IBC PANEL
Iron: 10 ug/dL — ABNORMAL LOW (ref 42–145)
SATURATION RATIOS: 2.2 % — AB (ref 20.0–50.0)
Transferrin: 332 mg/dL (ref 212.0–360.0)

## 2015-07-30 LAB — ABO/RH: ABO/RH(D): O POS

## 2015-07-30 LAB — FERRITIN: Ferritin: 3.7 ng/mL — ABNORMAL LOW (ref 10.0–291.0)

## 2015-07-30 MED ORDER — RIVAROXABAN 20 MG PO TABS: ORAL_TABLET | ORAL | Status: DC

## 2015-07-30 MED ORDER — FERRALET 90 90-1 MG PO TABS: 1.0000 | ORAL_TABLET | Freq: Every day | ORAL | Status: DC

## 2015-07-30 MED ORDER — SODIUM CHLORIDE 0.9 % IV SOLN
10.0000 mL/h | Freq: Once | INTRAVENOUS | Status: AC
  Administered 2015-07-30: 10 mL/h via INTRAVENOUS

## 2015-07-30 MED ORDER — FERROUS SULFATE 325 (65 FE) MG PO TABS
325.0000 mg | ORAL_TABLET | Freq: Two times a day (BID) | ORAL | Status: DC
  Administered 2015-07-31 – 2015-08-01 (×3): 325 mg via ORAL
  Filled 2015-07-30 (×3): qty 1

## 2015-07-30 MED ORDER — RIVAROXABAN 20 MG PO TABS
20.0000 mg | ORAL_TABLET | Freq: Every day | ORAL | Status: DC
  Administered 2015-07-31: 20 mg via ORAL
  Filled 2015-07-30 (×2): qty 1

## 2015-07-30 NOTE — H&P (Signed)
History and Physical    Sarah Hudson [id number] DOB: [birth year] DOA: 07/30/2015  Referring MD/NP/PA: Dr. Laneta Simmers PCP: Mauricio Po, [facility] Specialists: None Patient coming from: Home  Chief Complaint: Anemia  HPI: Sarah Hudson is a 50 y.o. female with medical history significant of DVT x 2 last year and PE in October of last year.  Patient initially diagnosed with DVT in Feb, put on Xarelto, didn't follow up so only took 1 month of this med.  After another car trip in October had DVT and PE, put back on Xarelto for 6 months.  Per PCP plan was to recheck for clot at the 6 month mark and if no clot then stop the Xarelto due to patient having ongoing trouble with menorrhagia while on Xarelto.  ED Course: Patient presents to ED after routine labwork with PCP reveals HGB of 6.7.  Despite taking Ferralet since initial diagnosis of iron deficiency anemia back in September of last year, lab work at her PCPs office shows worsening of the iron deficiency.  Review of Systems: As per HPI otherwise 10 point review of systems negative.    Past Medical History  Diagnosis Date  . Hx of blood clots   . Chicken pox   . Anemia     Past Surgical History  Procedure Laterality Date  . Umbilical hernia repair    . Tubal ligation       reports that she has never smoked. She has never used smokeless tobacco. She reports that she drinks alcohol. She reports that she does not use illicit drugs.  No Known Allergies  Family History  Problem Relation Age of Onset  . Arthritis Mother   . Hypertension Mother   . Diabetes Mother   . Hypertension Father   . Heart attack Maternal Grandfather     50's     Prior to Admission medications   Medication Sig Start Date End Date Taking? Authorizing Provider  acetaminophen (TYLENOL) 325 MG tablet Take 650 mg by mouth every 6 (six) hours as needed (pain).   Yes Historical Provider, MD  Acetaminophen-Caff-Pyrilamine (MIDOL COMPLETE PO) Take 2  tablets by mouth daily as needed (menstrual cramps).   Yes Historical Provider, MD  Fe Cbn-Fe Gluc-FA-B12-C-DSS (FERRALET 90) 90-1 MG TABS Take 1 tablet by mouth daily. 07/30/15  Yes Golden Circle, FNP  rivaroxaban (XARELTO) 20 MG TABS tablet TAKE 1 TABLET(20 MG) BY MOUTH DAILY WITH DINNER Patient taking differently: Take 20 mg by mouth daily with supper.  07/30/15  Yes Golden Circle, FNP    Physical Exam: Filed Vitals:   07/30/15 1630 07/30/15 1845  BP: 151/64 133/74  Pulse: 90 72  Temp: 98.7 F (37.1 C)   TempSrc: Oral   Resp: 18 18  SpO2: 100% 100%      Constitutional: NAD, calm, comfortable Filed Vitals:   07/30/15 1630 07/30/15 1845  BP: 151/64 133/74  Pulse: 90 72  Temp: 98.7 F (37.1 C)   TempSrc: Oral   Resp: 18 18  SpO2: 100% 100%   Eyes: PERRL, lids and conjunctivae normal ENMT: Mucous membranes are moist. Posterior pharynx clear of any exudate or lesions.Normal dentition.  Neck: normal, supple, no masses, no thyromegaly Respiratory: clear to auscultation bilaterally, no wheezing, no crackles. Normal respiratory effort. No accessory muscle use.  Cardiovascular: Regular rate and rhythm, no murmurs / rubs / gallops. No extremity edema. 2+ pedal pulses. No carotid bruits.  Abdomen: no tenderness, no masses palpated. No hepatosplenomegaly. Bowel sounds  positive.  Musculoskeletal: no clubbing / cyanosis. No joint deformity upper and lower extremities. Good ROM, no contractures. Normal muscle tone.  Skin: no rashes, lesions, ulcers. No induration Neurologic: CN 2-12 grossly intact. Sensation intact, DTR normal. Strength 5/5 in all 4.  Psychiatric: Normal judgment and insight. Alert and oriented x 3. Normal mood.    Labs on Admission: I have personally reviewed following labs and imaging studies  CBC:  Recent Labs Lab 07/30/15 1154 07/30/15 1630  WBC 5.1 5.4  NEUTROABS  --  2.2  HGB 6.7 Repeated and verified X2.* 6.6*  HCT 22.9 Repeated and verified X2.*  24.0*  MCV 64.3 Repeated and verified X2.* 67.6*  PLT 324.0 0000000   Basic Metabolic Panel:  Recent Labs Lab 07/30/15 1630  NA 137  K 4.0  CL 108  CO2 21*  GLUCOSE 90  BUN 6  CREATININE 0.77  CALCIUM 8.8*   GFR: Estimated Creatinine Clearance: 100.6 mL/min (by C-G formula based on Cr of 0.77). Liver Function Tests: No results for input(s): AST, ALT, ALKPHOS, BILITOT, PROT, ALBUMIN in the last 168 hours. No results for input(s): LIPASE, AMYLASE in the last 168 hours. No results for input(s): AMMONIA in the last 168 hours. Coagulation Profile: No results for input(s): INR, PROTIME in the last 168 hours. Cardiac Enzymes: No results for input(s): CKTOTAL, CKMB, CKMBINDEX, TROPONINI in the last 168 hours. BNP (last 3 results) No results for input(s): PROBNP in the last 8760 hours. HbA1C: No results for input(s): HGBA1C in the last 72 hours. CBG: No results for input(s): GLUCAP in the last 168 hours. Lipid Profile: No results for input(s): CHOL, HDL, LDLCALC, TRIG, CHOLHDL, LDLDIRECT in the last 72 hours. Thyroid Function Tests: No results for input(s): TSH, T4TOTAL, FREET4, T3FREE, THYROIDAB in the last 72 hours. Anemia Panel:  Recent Labs  07/30/15 1154  FERRITIN 3.7*  IRON 10*   Urine analysis:    Component Value Date/Time   COLORURINE YELLOW 05/17/2009 1849   APPEARANCEUR CLOUDY* 05/17/2009 1849   LABSPEC 1.020 08/23/2009 0940   PHURINE 6.5 08/23/2009 0940   GLUCOSEU NEGATIVE 08/23/2009 0940   HGBUR TRACE* 08/23/2009 0940   BILIRUBINUR NEGATIVE 08/23/2009 0940   KETONESUR 15* 08/23/2009 0940   PROTEINUR NEGATIVE 08/23/2009 0940   UROBILINOGEN 0.2 08/23/2009 0940   NITRITE NEGATIVE 08/23/2009 0940   LEUKOCYTESUR  08/23/2009 0940    NEGATIVE Biochemical Testing Only. Please order routine urinalysis from main lab if confirmatory testing is needed.   Sepsis Labs: @LABRCNTIP (procalcitonin:4,lacticidven:4) )No results found for this or any previous visit (from  the past 240 hour(s)).   Radiological Exams on Admission: No results found.  EKG: Independently reviewed.  Assessment/Plan Principal Problem:   Iron (Fe) deficiency anemia Active Problems:   Chronic anticoagulation   Menorrhagia   Symptomatic anemia    Iron Deficiency anemia -  Transfuse 1 unit for HGB of 6.6  Increasing iron dosing to BID  Chronic Anticoagulation -  No active bleed at this point  Will continue Xarelto for now  Per patient plan is to follow up with PCP, check [place] of lower extremity, and stop Xarelto if clot has resorbed.   DVT prophylaxis: Xarelto Code Status: Full Family Communication: Family at bedside Consults called: None Admission status: Admit to obs   Alexzavier Girardin, [facility] Pager [phone] from 7PM-7AM  If 7AM-7PM, please contact the day physician for the patient [email or web address] Password [email or web address]  07/30/2015, 8:25 PM

## 2015-07-30 NOTE — Telephone Encounter (Signed)
Pt aware.

## 2015-07-30 NOTE — Assessment & Plan Note (Signed)
Iron deficency complicated by anticoagulation during her menstrual cycle. Obtain CBC, IBC panel and ferritin. Continue current dosage of Feralat pending blood work results.

## 2015-07-30 NOTE — Telephone Encounter (Signed)
Please call and inform patient that her hemoglobin is low which is most likely related to her menstrual bleeding. Although she is not having symptoms it would be best for her to go to the Emergency Department for further evaluation and possible blood transfusion.

## 2015-07-30 NOTE — Progress Notes (Signed)
Subjective:    Patient ID: Sarah Hudson, female    DOB: 1965/05/09, 50 y.o.   MRN: [id number]  Chief Complaint  Patient presents with  . Follow-up    on DVT    HPI:  Sarah Hudson is a 50 y.o. female who  has a past medical history of blood clots; Chicken pox; and Anemia. and presents today For an office follow-up.  1.) DVT/PE - previously diagnosed with DVT and PE in October 2016 and maintained on Xarelto for anticoagulation. She was initially diagnosed in February with a DVT and only partially treated it. This occurred following a long car ride. Reports taking the medication as prescribed with the side effects of increased menstrual cycle. Denies any chest pain, calf pain, or shortness of breath.  2.) Iron deficiency -Previously diagnosed with iron deficiency anemia and started on Ferralet. Reports taking the medication as prescribed and denies adverse side effects. Denies any symptoms of shortness of breath or decreased exercise tolerance.   No Known Allergies   Outpatient Prescriptions Prior to Visit  Medication Sig Dispense Refill  . Fe Cbn-Fe Gluc-FA-B12-C-DSS (FERRALET 90) 90-1 MG TABS Take 1 tablet by mouth daily. 30 each 2  . XARELTO 20 MG TABS tablet TAKE 1 TABLET(20 MG) BY MOUTH DAILY WITH DINNER 30 tablet 1   No facility-administered medications prior to visit.     Review of Systems  Constitutional: Negative for fever and chills.  Eyes:       Negative for changes in vision  Respiratory: Negative for cough, chest tightness and wheezing.   Cardiovascular: Negative for chest pain, palpitations and leg swelling.  Neurological: Negative for dizziness, weakness and light-headedness.  Hematological: Does not bruise/bleed easily.      Objective:    BP 112/64 mmHg  Pulse 70  Temp(Src) 98.2 F (36.8 C) (Oral)  Resp 16  Ht 5' 4.5" (1.638 m)  Wt 219 lb (99.338 kg)  BMI 37.02 kg/m2  SpO2 99% Nursing note and vital signs reviewed.  Physical Exam    Constitutional: She is oriented to person, place, and time. She appears well-developed and well-nourished. No distress.  Cardiovascular: Normal rate, regular rhythm, normal heart sounds and intact distal pulses.   Pulmonary/Chest: Effort normal and breath sounds normal.  Musculoskeletal:  Left calf - no obvious deformity, discoloration, or edema noted. No palpable tenderness with normal range of motion. Distal pulses and sensation are intact and appropriate. Negative Homans sign.  Neurological: She is alert and oriented to person, place, and time.  Skin: Skin is warm and dry.  Psychiatric: She has a normal mood and affect. Her behavior is normal. Judgment and thought content normal.       Assessment & Plan:   Problem List Items Addressed This Visit      Cardiovascular and Mediastinum   Pulmonary embolism (HCC)   Relevant Medications   rivaroxaban (XARELTO) 20 MG TABS tablet   DVT (deep venous thrombosis) (HCC) - Primary    No current chest pain or calf pain noted. Continue current dosage of Xarelto. Obtain lower extremity venous dopplers to determine current status. If no DVT noted will stop Xarelto and obtain a hypercoagulability panel. If DVT continues to exist will refer to hematology and maintain Xarelto.       Relevant Medications   rivaroxaban (XARELTO) 20 MG TABS tablet   Other Relevant Orders   VAS [place] LOWER EXTREMITY VENOUS (DVT)     Other   Iron (Fe) deficiency anemia  Iron deficency complicated by anticoagulation during her menstrual cycle. Obtain CBC, IBC panel and ferritin. Continue current dosage of Feralat pending blood work results.       Relevant Medications   Fe Cbn-Fe Gluc-FA-B12-C-DSS (FERRALET 90) 90-1 MG TABS   Other Relevant Orders   CBC   Ferritin   IBC panel       I have changed [name] to rivaroxaban. I am also having her maintain her FERRALET 90.   Meds ordered this encounter  Medications  . Fe Cbn-Fe Gluc-FA-B12-C-DSS  (FERRALET 90) 90-1 MG TABS    Sig: Take 1 tablet by mouth daily.    Dispense:  30 each    Refill:  2    Order Specific Question:  Supervising Provider    Answer:  Pricilla Holm A [id number]  . rivaroxaban (XARELTO) 20 MG TABS tablet    Sig: TAKE 1 TABLET(20 MG) BY MOUTH DAILY WITH DINNER    Dispense:  30 tablet    Refill:  1    Order Specific Question:  Supervising Provider    Answer:  Pricilla Holm A [id number]     Follow-up: Pending US/blood work results   Mauricio Po, FNP

## 2015-07-30 NOTE — ED Notes (Signed)
Critical hgb given to MD Eulis Foster.

## 2015-07-30 NOTE — ED Provider Notes (Signed)
CSN: [id number]     Arrival date & time 07/30/15  1606 History   First MD Initiated Contact with Patient 07/30/15 1921     Chief Complaint  Patient presents with  . Anemia    (Consider location/radiation/quality/duration/timing/severity/associated sxs/prior Treatment) Patient is a 50 y.o. female presenting with weakness. The history is provided by the patient.  Weakness This is a new problem. The current episode started more than 1 week ago. The problem occurs constantly. The problem has not changed since onset.Pertinent negatives include no chest pain and no abdominal pain. Nothing aggravates the symptoms. Nothing relieves the symptoms. She has tried nothing for the symptoms.    Past Medical History  Diagnosis Date  . Hx of blood clots   . Chicken pox   . Anemia    Past Surgical History  Procedure Laterality Date  . Umbilical hernia repair    . Tubal ligation     Family History  Problem Relation Age of Onset  . Arthritis Mother   . Hypertension Mother   . Diabetes Mother   . Hypertension Father   . Heart attack Maternal Grandfather     50's   Social History  Substance Use Topics  . Smoking status: Never Smoker   . Smokeless tobacco: Never Used  . Alcohol Use: Yes     Comment: Occasional   OB History    No data available     Review of Systems  Cardiovascular: Negative for chest pain.  Gastrointestinal: Negative for abdominal pain.  Neurological: Positive for weakness.  All other systems reviewed and are negative.     Allergies  Review of patient's allergies indicates no known allergies.  Home Medications   Prior to Admission medications   Medication Sig Start Date End Date Taking? Authorizing Provider  Fe Cbn-Fe Gluc-FA-B12-C-DSS (FERRALET 90) 90-1 MG TABS Take 1 tablet by mouth daily. 07/30/15   Golden Circle, FNP  rivaroxaban (XARELTO) 20 MG TABS tablet TAKE 1 TABLET(20 MG) BY MOUTH DAILY WITH DINNER 07/30/15   Golden Circle, FNP   BP 133/74 mmHg   Pulse 72  Temp(Src) 98.7 F (37.1 C) (Oral)  Resp 18  SpO2 100% Physical Exam  Constitutional: She is oriented to person, place, and time. She appears well-developed and well-nourished. No distress.  HENT:  Head: Normocephalic.  Eyes:  Conjunctival pallor  Neck: Neck supple. No tracheal deviation present.  Cardiovascular: Normal rate, regular rhythm and normal heart sounds.   Pulmonary/Chest: Effort normal and breath sounds normal. No respiratory distress.  Abdominal: Soft. She exhibits no distension. There is no tenderness.  Neurological: She is alert and oriented to person, place, and time.  Skin: Skin is warm and dry.  Psychiatric: She has a normal mood and affect.    ED Course  Procedures (including critical care time)  CRITICAL CARE Performed by: Leo Grosser Total critical care time: 30 minutes Critical care time was exclusive of separately billable procedures and treating other patients. Critical care was necessary to treat or prevent imminent or life-threatening deterioration. Critical care was time spent personally by me on the following activities: development of treatment plan with patient and/or surrogate as well as nursing, discussions with consultants, evaluation of patient's response to treatment, examination of patient, obtaining history from patient or surrogate, ordering and performing treatments and interventions, ordering and review of laboratory studies, ordering and review of radiographic studies, pulse oximetry and re-evaluation of patient's condition.   Labs Review Labs Reviewed  CBC WITH DIFFERENTIAL/PLATELET - Abnormal;  Notable for the following:    RBC 3.55 (*)    Hemoglobin 6.6 (*)    HCT 24.0 (*)    MCV 67.6 (*)    MCH 18.6 (*)    MCHC 27.5 (*)    RDW 20.2 (*)    All other components within normal limits  BASIC METABOLIC PANEL - Abnormal; Notable for the following:    CO2 21 (*)    Calcium 8.8 (*)    All other components within normal limits   CBC  TYPE AND SCREEN  ABO/RH  PREPARE RBC (CROSSMATCH)    Imaging Review No results found. I have personally reviewed and evaluated these images and lab results as part of my medical decision-making.   EKG Interpretation None      MDM   Final diagnoses:  Symptomatic anemia  Anticoagulated by anticoagulation treatment  Menorrhagia with regular cycle    49 y.o. female presents with symptomatic anemia following menses. She is on xarelto for DVT/PE and has noticed since being on this that she has had heavier periods. I explained need for urgent blood product administration and admission given degree of anemia. Administered 1u PRBC with plan to recheck for further transfusion needs as IP. No noted active bleeding source. Hospitalist was consulted for admission and will see the patient in the emergency department.     Leo Grosser, MD 07/31/15 [phone]

## 2015-07-30 NOTE — Patient Instructions (Addendum)
Thank you for choosing [facility].  Summary/Instructions:  Please continue to take the Xarelto and iron as prescribed.  They will call to schedule your ultrasound. Follow up pending the results for the next step  Your prescription(s) have been submitted to your pharmacy or been printed and provided for you. Please take as directed and contact our office if you believe you are having problem(s) with the medication(s) or have any questions.  Please stop by the lab on the basement [address] of the building for your blood work. Your results will be released to [place] (or called to you) after review, usually within 72 hours after test completion. If any changes need to be made, you will be notified at that same time.  If your symptoms worsen or fail to improve, please contact our office for further instruction, or in case of emergency go directly to the emergency room at the closest medical facility.

## 2015-07-30 NOTE — ED Notes (Signed)
Pt has been on xarelto since October for blood clots. She has had heavy periods with sob and fatigue during her periods since. She went in for check up today and they told her to come to ED for hgb of 6.6. She is alert, breathing easily, denies complaints now.

## 2015-07-30 NOTE — Assessment & Plan Note (Signed)
No current chest pain or calf pain noted. Continue current dosage of Xarelto. Obtain lower extremity venous dopplers to determine current status. If no DVT noted will stop Xarelto and obtain a hypercoagulability panel. If DVT continues to exist will refer to hematology and maintain Xarelto.

## 2015-07-30 NOTE — Telephone Encounter (Signed)
Lab called to give Critical for Hgb 6.7, HCT 22.9

## 2015-07-30 NOTE — Telephone Encounter (Signed)
Tried calling pt to let her know. Will try calling back.

## 2015-07-31 ENCOUNTER — Observation Stay (HOSPITAL_BASED_OUTPATIENT_CLINIC_OR_DEPARTMENT_OTHER): Payer: BLUE CROSS/BLUE SHIELD

## 2015-07-31 DIAGNOSIS — N920 Excessive and frequent menstruation with regular cycle: Secondary | ICD-10-CM | POA: Diagnosis not present

## 2015-07-31 DIAGNOSIS — Z7901 Long term (current) use of anticoagulants: Secondary | ICD-10-CM | POA: Diagnosis not present

## 2015-07-31 DIAGNOSIS — D649 Anemia, unspecified: Secondary | ICD-10-CM | POA: Diagnosis not present

## 2015-07-31 DIAGNOSIS — I82402 Acute embolism and thrombosis of unspecified deep veins of left lower extremity: Secondary | ICD-10-CM

## 2015-07-31 DIAGNOSIS — D509 Iron deficiency anemia, unspecified: Secondary | ICD-10-CM | POA: Diagnosis not present

## 2015-07-31 LAB — HEMOGLOBIN AND HEMATOCRIT, BLOOD
HEMATOCRIT: 28.2 % — AB (ref 36.0–46.0)
Hemoglobin: 8.2 g/dL — ABNORMAL LOW (ref 12.0–15.0)

## 2015-07-31 LAB — CBC
HEMATOCRIT: 25.1 % — AB (ref 36.0–46.0)
HEMOGLOBIN: 7.1 g/dL — AB (ref 12.0–15.0)
MCH: 19.5 pg — ABNORMAL LOW (ref 26.0–34.0)
MCHC: 28.3 g/dL — AB (ref 30.0–36.0)
MCV: 68.8 fL — ABNORMAL LOW (ref 78.0–100.0)
Platelets: 237 10*3/uL (ref 150–400)
RBC: 3.65 MIL/uL — ABNORMAL LOW (ref 3.87–5.11)
RDW: 20.1 % — AB (ref 11.5–15.5)
WBC: 5.4 10*3/uL (ref 4.0–10.5)

## 2015-07-31 LAB — PREPARE RBC (CROSSMATCH)

## 2015-07-31 MED ORDER — IRON DEXTRAN 50 MG/ML IJ SOLN: 25.0000 mg | Freq: Once | INTRAMUSCULAR | Status: AC

## 2015-07-31 MED ORDER — SODIUM CHLORIDE 0.9 % IV SOLN
500.0000 mg | Freq: Two times a day (BID) | INTRAVENOUS | Status: AC
  Administered 2015-07-31 – 2015-08-01 (×2): 500 mg via INTRAVENOUS
  Filled 2015-07-31 (×3): qty 10

## 2015-07-31 MED ORDER — IRON DEXTRAN 50 MG/ML IJ SOLN
25.0000 mg | Freq: Once | INTRAMUSCULAR | Status: AC
  Administered 2015-07-31: 25 mg via INTRAVENOUS
  Filled 2015-07-31: qty 0.5

## 2015-07-31 MED ORDER — SODIUM CHLORIDE 0.9 % IV SOLN
Freq: Once | INTRAVENOUS | Status: AC
  Administered 2015-07-31: 12:00:00 via INTRAVENOUS

## 2015-07-31 NOTE — Progress Notes (Addendum)
PROGRESS NOTE  Sarah Hudson  [id number] DOB: 1966/02/29  DOA: 07/30/2015 PCP: Mauricio Po, FNP  Outpatient Specialists:  None  Brief Narrative:  50 y.o. female with medical history significant of DVT x 2 last year and PE in October of last year. Patient initially diagnosed with DVT in Feb, put on Xarelto, didn't follow up so only took 1 month of this med. After another car trip in October had DVT and PE, put back on Xarelto for 6 months. Per PCP plan was to recheck for clot at the 6 month mark and if no clot then stop the Xarelto due to patient having ongoing trouble with menorrhagia while on Xarelto. Patient went to PCPs office for follow-up regarding venous Doppler. Lab work there showed hemoglobin 6.7. Patient was sent to the ED where repeat hemoglobin was 6.6. Transfused one unit of PRBC on night of admission. Getting second unit today.   Assessment & Plan:   Principal Problem:   Iron (Fe) deficiency anemia Active Problems:   Chronic anticoagulation   Menorrhagia   Symptomatic anemia   Iron deficiency anemia - Baseline hemoglobin in the 10 g per DL range. Admitted with hemoglobin of 6.6. Transfused one unit of PRBC night of admission and hemoglobin improved to 7.1. - Due to risk for dropping again, will transfuse additional unit of PRBC and follow CBC. - Discussed extensively with patient and spouse at bedside regarding options of Iron replacement i.e. oral versus IV, risks and benefits. They opted to proceed with IV iron replacement per pharmacy. - Advised patient to consider outpatient GYN consultation if decision to continue anticoagulation was made. - Anemia panel: Iron 10, ferritin 3.7, saturation ratio 2.2, B12: 346.  Menorrhagia - Prior to anticoagulation, she stated that she used to have 3 days of mild menstrual bleeding. Since anticoagulation, she has 7 days of heavy menstrual bleeding and has to change pads frequently and has blood clots. - As stated above, if  decision made to continue anticoagulation, recommend outpatient GYN consultation for definitive treatment for menorrhagia. - LMP 07/22/15.  VTE on Xarelto - She developed first left lower extremity DVT in February 2016 following a 26 hour [place] from [place]. He took anticoagulation only for 1 month and then stopped by herself.  - Had left lower extremity DVT and PE in October 2016 and has been on Xarelto since. As per initial plans, plan was to continue anticoagulation for 6 months, reevaluate with left lower extremity venous Doppler and if negative, plan was to discontinue anticoagulation.  - Patient has had 2 separate episodes of VTE and has family history of DVT in her father. She may have a hypercoagulable state and hence may need to continue anticoagulation for long-term, recommend outpatient hematology consultation to evaluate for hypercoagulable state and GYN consultation to treat menorrhagia so she can be anticoagulated.  - This was discussed in detail with patient and her spouse at bedside and they were advised to follow-up with PCP upon discharge.    DVT prophylaxis: Xarelto Code Status: Full Family Communication: Discussed extensively with patient's spouse at bedside.  Disposition Plan: DC home possibly 08/01/15.    Consultants:   None  Procedures:   None   Antimicrobials:   None     Subjective: Denies complaints. No chest pain, dyspnea or left lower extremity symptoms. LMP 07/22/15.   Objective:  Filed Vitals:   07/31/15 0525 07/31/15 1306 07/31/15 1330 07/31/15 1330  BP: 110/64 110/67 112/60 112/60  Pulse: 71 65 72 69  Temp: 98.1 F (36.7 C) 97.8 F (36.6 C) 98.2 F (36.8 C) 98.2 F (36.8 C)  TempSrc: Oral  Oral   Resp: 16 18 20 20   Height:      Weight:      SpO2: 100% 100% 100% 100%    Intake/Output Summary (Last 24 hours) at 07/31/15 1413 Last data filed at 07/30/15 2215  Gross per 24 hour  Intake   1179 ml  Output      0 ml  Net   1179 ml   Filed  Weights   07/30/15 2207  Weight: 98.884 kg (218 lb)    Examination:  General exam:  pleasant young female lying comfortably supine in bed. Appears calm and comfortable  Respiratory system: Clear to auscultation. Respiratory effort normal. Cardiovascular system: S1 & S2 heard, RRR.Marland Kitchen No JVD, murmurs, rubs, gallops or clicks. No pedal edema. Gastrointestinal system: Abdomen is nondistended, soft and nontender. No organomegaly or masses felt. Normal bowel sounds heard. Central nervous system: Alert and oriented. No focal neurological deficits. Extremities: Symmetric 5 x 5 power. Skin: No rashes, lesions or ulcers Psychiatry: Judgement and insight appear normal. Mood & affect appropriate.     Data Reviewed: I have personally reviewed following labs and imaging studies  CBC:  Recent Labs Lab 07/30/15 1154 07/30/15 1630 07/31/15 0440  WBC 5.1 5.4 5.4  NEUTROABS  --  2.2  --   HGB 6.7 Repeated and verified X2.* 6.6* 7.1*  HCT 22.9 Repeated and verified X2.* 24.0* 25.1*  MCV 64.3 Repeated and verified X2.* 67.6* 68.8*  PLT 324.0 293 [id number]   Basic Metabolic Panel:  Recent Labs Lab 07/30/15 1630  NA 137  K 4.0  CL 108  CO2 21*  GLUCOSE 90  BUN 6  CREATININE 0.77  CALCIUM 8.8*   GFR: Estimated Creatinine Clearance: 100.3 mL/min (by C-G formula based on Cr of 0.77). Liver Function Tests: No results for input(s): AST, ALT, ALKPHOS, BILITOT, PROT, ALBUMIN in the last 168 hours. No results for input(s): LIPASE, AMYLASE in the last 168 hours. No results for input(s): AMMONIA in the last 168 hours. Coagulation Profile: No results for input(s): INR, PROTIME in the last 168 hours. Cardiac Enzymes: No results for input(s): CKTOTAL, CKMB, CKMBINDEX, TROPONINI in the last 168 hours. BNP (last 3 results) No results for input(s): PROBNP in the last 8760 hours. HbA1C: No results for input(s): HGBA1C in the last 72 hours. CBG: No results for input(s): GLUCAP in the last 168  hours. Lipid Profile: No results for input(s): CHOL, HDL, LDLCALC, TRIG, CHOLHDL, LDLDIRECT in the last 72 hours. Thyroid Function Tests: No results for input(s): TSH, T4TOTAL, FREET4, T3FREE, THYROIDAB in the last 72 hours. Anemia Panel:  Recent Labs  07/30/15 1154  FERRITIN 3.7*  IRON 10*       Radiology Studies: No results found.      Scheduled Meds: . ferrous sulfate  325 mg Oral BID WC  . iron dextran (INFED/DEXFERRUM) infusion  500 mg Intravenous Q12H  . rivaroxaban  20 mg Oral Q supper   Continuous Infusions:       Time spent: 40 minutes.    [facility], MD [facility] Pager [phone]  If 7PM-7AM, please contact night-coverage [email or web address] Password [email or web address] 07/31/2015, 2:13 PM

## 2015-07-31 NOTE — Discharge Instructions (Addendum)
Information on my medicine - XARELTO (rivaroxaban)  WHY WAS XARELTO PRESCRIBED FOR YOU? Xarelto was prescribed to treat blood clots that may have been found in the veins of your legs (deep vein thrombosis) or in your lungs (pulmonary embolism) and to reduce the risk of them occurring again.  What do you need to know about Xarelto? The starting dose is one 15 mg tablet taken TWICE daily with food for the FIRST 21 DAYS; the dose is changed to one 20 mg tablet taken ONCE A DAY with your evening meal.  DO NOT stop taking Xarelto without talking to the health care provider who prescribed the medication.  Refill your prescription for 20 mg tablets before you run out.  After discharge, you should have regular check-up appointments with your healthcare provider that is prescribing your Xarelto.  In the future your dose may need to be changed if your kidney function changes by a significant amount.  What do you do if you miss a dose? If you are taking Xarelto TWICE DAILY and you miss a dose, take it as soon as you remember. You may take two 15 mg tablets (total 30 mg) at the same time then resume your regularly scheduled 15 mg twice daily the next day.  If you are taking Xarelto ONCE DAILY and you miss a dose, take it as soon as you remember on the same day then continue your regularly scheduled once daily regimen the next day. Do not take two doses of Xarelto at the same time.   Important Safety Information Xarelto is a blood thinner medicine that can cause bleeding. You should call your healthcare provider right away if you experience any of the following: ? Bleeding from an injury or your nose that does not stop. ? Unusual colored urine (red or dark brown) or unusual colored stools (red or black). ? Unusual bruising for unknown reasons. ? A serious fall or if you hit your head (even if there is no bleeding).  Some medicines may interact with Xarelto and might increase your risk of  bleeding while on Xarelto. To help avoid this, consult your healthcare provider or pharmacist prior to using any new prescription or non-prescription medications, including herbals, vitamins, non-steroidal anti-inflammatory drugs (NSAIDs) and supplements.  This website has more information on Xarelto: [email or web address]-benson.[email or web address]   Iron Deficiency Anemia, Adult Anemia is a condition in which there are less red blood cells or hemoglobin in the blood than normal. Hemoglobin is the part of red blood cells that carries oxygen. Iron deficiency anemia is anemia caused by too little iron. It is the most common type of anemia. It may leave you tired and short of breath. CAUSES   Lack of iron in the diet.  Poor absorption of iron, as seen with intestinal disorders.  Intestinal bleeding.  Heavy periods. SIGNS AND SYMPTOMS  Mild anemia may not be noticeable. Symptoms may include:  Fatigue.  Headache.  Pale skin.  Weakness.  Tiredness.  Shortness of breath.  Dizziness.  Cold hands and feet.  Fast or irregular heartbeat. DIAGNOSIS  Diagnosis requires a thorough evaluation and physical exam by your health care provider. Blood tests are generally used to confirm iron deficiency anemia. Additional tests may be done to find the underlying cause of your anemia. These may include:  Testing for blood in the stool (fecal occult blood test).  A procedure to see inside the colon and rectum (colonoscopy).  A procedure to see inside the esophagus and stomach (endoscopy). TREATMENT  Iron deficiency anemia is treated by correcting the cause of the deficiency. Treatment may involve:  Adding iron-rich foods to your diet.  Taking iron supplements. Pregnant or breastfeeding women need to take extra iron because their normal diet usually does not provide the required amount.  Taking vitamins. Vitamin C improves the absorption of iron. Your health care provider may recommend that you take your iron tablets  with a glass of orange juice or vitamin C supplement.  Medicines to make heavy menstrual flow lighter.  Surgery. HOME CARE INSTRUCTIONS   Take iron as directed by your health care provider.  If you cannot tolerate taking iron supplements by mouth, talk to your health care provider about taking them through a vein (intravenously) or an injection into a muscle.  For the best iron absorption, iron supplements should be taken on an empty stomach. If you cannot tolerate them on an empty stomach, you may need to take them with food.  Do not drink milk or take antacids at the same time as your iron supplements. Milk and antacids may interfere with the absorption of iron.  Iron supplements can cause constipation. Make sure to include fiber in your diet to prevent constipation. A stool softener may also be recommended.  Take vitamins as directed by your health care provider.  Eat a diet rich in iron. Foods high in iron include liver, lean beef, whole-grain bread, eggs, dried fruit, and dark green leafy vegetables. SEEK IMMEDIATE MEDICAL CARE IF:   You faint. If this happens, do not drive. Call your local emergency services (911 in [place] if no other help is available.  You have chest pain.  You feel nauseous or vomit.  You have severe or increased shortness of breath with activity.  You feel weak.  You have a rapid heartbeat.  You have unexplained sweating.  You become light-headed when getting up from a chair or bed. MAKE SURE YOU:   Understand these instructions.  Will watch your condition.  Will get help right away if you are not doing well or get worse.   This information is not intended to replace advice given to you by your health care provider. Make sure you discuss any questions you have with your health care provider.   Document Released: 03/14/2000 Document Revised: 04/07/2014 Document Reviewed: 11/22/2012 Elsevier Interactive Patient Education [employer].

## 2015-07-31 NOTE — Progress Notes (Signed)
*  PRELIMINARY RESULTS* Vascular Ultrasound Left lower extremity venous duplex has been completed.  Preliminary findings: No evidence of DVT or baker's cyst. Previous DVT appears to have resolved.   Landry Mellow, RDMS, RVT  07/31/2015, 5:00 PM

## 2015-07-31 NOTE — Care Management Note (Signed)
Case Management Note  Patient Details  Name: Amour Tuller Valtierra MRN: [id number] Date of Birth: September 18, 1965  Subjective/Objective:                 Spoke with patient in  The room. Patient is independent from home sent to hospital from MD office follow up for Xaralto that was prescribed for DVT. Patient has had c/o meningorrhea, hgb 6.6 and patient received one transfusion.    Action/Plan:  Pt will dc to home, self care. Anticipate DC tomorrow.  Expected Discharge Date:                  Expected Discharge Plan:  Home/Self Care  In-House Referral:     Discharge planning Services  CM Consult  Post Acute Care Choice:  NA Choice offered to:     DME Arranged:    DME Agency:     HH Arranged:    [place] Agency:     Status of Service:  Completed, signed off  Medicare Important Message Given:    Date Medicare IM Given:    Medicare IM give by:    Date Additional Medicare IM Given:    Additional Medicare Important Message give by:     If discussed at [place] of Stay Meetings, dates discussed:    Additional Comments:  Carles Collet, RN 07/31/2015, 12:16 PM

## 2015-07-31 NOTE — Progress Notes (Signed)
MEDICATION RELATED CONSULT NOTE - INITIAL   Pharmacy Consult for IV iron Indication: Iron-deficiency anemia  No Known Allergies  Patient Measurements: Height: 5' 4.5" (163.8 cm) Weight: 218 lb (98.884 kg) IBW/kg (Calculated) : 55.85 Adjusted Body Weight:   Vital Signs: Temp: 98.2 F (36.8 C) (05/02 1330) Temp Source: Oral (05/02 1330) BP: 112/60 mmHg (05/02 1330) Pulse Rate: 69 (05/02 1330) Intake/Output from previous day: 05/01 0701 - 05/02 0700 In: [id number] [I.V.:500; Blood:679] Out: -  Intake/Output from this shift:    Labs:  Recent Labs  07/30/15 1154 07/30/15 1630 07/31/15 0440  WBC 5.1 5.4 5.4  HGB 6.7 Repeated and verified X2.* 6.6* 7.1*  HCT 22.9 Repeated and verified X2.* 24.0* 25.1*  PLT 324.0 293 237  CREATININE  --  0.77  --    Estimated Creatinine Clearance: 100.3 mL/min (by C-G formula based on Cr of 0.77).   Microbiology: No results found for this or any previous visit (from the past 720 hour(s)).  Medical History: Past Medical History  Diagnosis Date  . Hx of blood clots   . Chicken pox   . Anemia     Medications:  Prescriptions prior to admission  Medication Sig Dispense Refill Last Dose  . acetaminophen (TYLENOL) 325 MG tablet Take 650 mg by mouth every 6 (six) hours as needed (pain).   week ago  . Acetaminophen-Caff-Pyrilamine (MIDOL COMPLETE PO) Take 2 tablets by mouth daily as needed (menstrual cramps).   1-2 weeks ago  . Fe Cbn-Fe Gluc-FA-B12-C-DSS (FERRALET 90) 90-1 MG TABS Take 1 tablet by mouth daily. 30 each 2 07/30/2015 at Unknown time  . rivaroxaban (XARELTO) 20 MG TABS tablet TAKE 1 TABLET(20 MG) BY MOUTH DAILY WITH DINNER (Patient taking differently: Take 20 mg by mouth daily with supper. ) 30 tablet 1 07/29/2015 at Unknown time    Assessment: 50 yo female on Xarelto for hx of DVT and PE. She Initially had a DVT last February, she took Xarelto short term, developed a PE and DVT in Octoberof 2016 and has been on Xarelto ever since.   She complains of severe menorrhagia and was found to have a hgb of 6.6 at her PCP's office. Her iron panel shows iron-deficiency anemia. Fe 10, tsat 2.2, Ferritin 3.7. She has also received two units of pRBC this admission. Based on calculations she has an iron deficit of ~ 2 g.    Goal of Therapy:  Iron supplementation    Plan:  -Iron dextran 25 mg IV x1 test dose -Iron dextran 500 mg IV x 2 doses -Continue Fe 325 mg po bid    Hughes Better M 07/31/2015,1:50 PM

## 2015-08-01 DIAGNOSIS — D509 Iron deficiency anemia, unspecified: Secondary | ICD-10-CM | POA: Diagnosis not present

## 2015-08-01 DIAGNOSIS — D649 Anemia, unspecified: Secondary | ICD-10-CM | POA: Diagnosis not present

## 2015-08-01 DIAGNOSIS — Z7901 Long term (current) use of anticoagulants: Secondary | ICD-10-CM | POA: Diagnosis not present

## 2015-08-01 DIAGNOSIS — N920 Excessive and frequent menstruation with regular cycle: Secondary | ICD-10-CM | POA: Diagnosis not present

## 2015-08-01 LAB — CBC
HCT: 27.9 % — ABNORMAL LOW (ref 36.0–46.0)
HEMOGLOBIN: 8.2 g/dL — AB (ref 12.0–15.0)
MCH: 21.2 pg — AB (ref 26.0–34.0)
MCHC: 29.4 g/dL — ABNORMAL LOW (ref 30.0–36.0)
MCV: 72.1 fL — ABNORMAL LOW (ref 78.0–100.0)
PLATELETS: 237 10*3/uL (ref 150–400)
RBC: 3.87 MIL/uL (ref 3.87–5.11)
RDW: 20.9 % — ABNORMAL HIGH (ref 11.5–15.5)
WBC: 6.7 10*3/uL (ref 4.0–10.5)

## 2015-08-01 LAB — TYPE AND SCREEN
ABO/RH(D): O POS
Antibody Screen: NEGATIVE
UNIT DIVISION: 0
UNIT DIVISION: 0

## 2015-08-01 MED ORDER — FERROUS SULFATE 325 (65 FE) MG PO TABS: 325.0000 mg | ORAL_TABLET | Freq: Three times a day (TID) | ORAL | Status: DC

## 2015-08-01 NOTE — Progress Notes (Signed)
Nsg Discharge Note  Admit Date:  07/30/2015 Discharge date: 08/01/2015   Innocence Sosna Procter to be D/C'd Home per MD order.  AVS completed.  Copy for chart, and copy for patient signed, and dated. Patient/caregiver able to verbalize understanding.  Discharge Medication:   Medication List    STOP taking these medications        FERRALET 90 90-1 MG Tabs      TAKE these medications        acetaminophen 325 MG tablet  Commonly known as:  TYLENOL  Take 650 mg by mouth every 6 (six) hours as needed (pain).     ferrous sulfate 325 (65 FE) MG tablet  Take 1 tablet (325 mg total) by mouth 3 (three) times daily with meals.     MIDOL COMPLETE PO  Take 2 tablets by mouth daily as needed (menstrual cramps).     rivaroxaban 20 MG Tabs tablet  Commonly known as:  XARELTO  TAKE 1 TABLET(20 MG) BY MOUTH DAILY WITH DINNER        Discharge Assessment: Filed Vitals:   07/31/15 2115 08/01/15 0525  BP: 122/53 130/66  Pulse: 73 69  Temp: 98.2 F (36.8 C) 98.6 F (37 C)  Resp: 16 16   Skin clean, dry and intact without evidence of skin break down, no evidence of skin tears noted. IV catheter discontinued intact. Site without signs and symptoms of complications - no redness or edema noted at insertion site, patient denies c/o pain - only slight tenderness at site.  Dressing with slight pressure applied.  D/c Instructions-Education: Discharge instructions given to patient/family with verbalized understanding. D/c education completed with patient/family including follow up instructions, medication list, d/c activities limitations if indicated, with other d/c instructions as indicated by MD - patient able to verbalize understanding, all questions fully answered. Patient instructed to return to ED, call 911, or call MD for any changes in condition.  Patient escorted via [place], and D/C home via private auto.  Dayle Points, RN 08/01/2015 2:41 PM

## 2015-08-01 NOTE — Discharge Summary (Addendum)
Physician Discharge Summary  Sarah Hudson  [id number]  DOB: 1965-05-13  DOA: 07/30/2015  PCP: Mauricio Po, FNP  Admit date: 07/30/2015 Discharge date: 08/01/2015  Time spent: Greater than 30 minutes  Recommendations for Outpatient Follow-up:  1. Gaetano Hawthorne, FNP/PCP in 5 days with repeat labs (CBC) 2. 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 3. Recommend outpatient GYN consultation for evaluation and management of menorrhagia.  Discharge Diagnoses:  Principal Problem:   Iron (Fe) deficiency anemia Active Problems:   Chronic anticoagulation   Menorrhagia   Symptomatic anemia   Discharge Condition: Improved & Stable  Diet recommendation: Heart healthy diet.  Filed Weights   07/30/15 2207  Weight: 98.884 kg (218 lb)    History of present illness:  50 y.o. female with medical history significant of DVT x 2 last year and PE in October of last year. Patient initially diagnosed with DVT in Feb, put on Xarelto, didn't follow up so only took 1 month of this med. After another car trip in October had DVT and PE, put back on Xarelto for 6 months. Per PCP plan was to recheck for clot at the 6 month mark and if no clot then stop the Xarelto due to patient having ongoing trouble with menorrhagia while on Xarelto. Patient went to PCPs office for follow-up regarding venous Doppler. Lab work there showed hemoglobin 6.7. Patient was sent to the ED where repeat hemoglobin was 6.6.  Hospital Course:   Iron deficiency anemia - Baseline hemoglobin in the 10 g per DL range. Admitted with hemoglobin of 6.6. Transfused one unit of PRBC night of admission and hemoglobin improved to 7.1. - Due to risk of dropping again, transfused additional unit of PRBC. Hemoglobin has improved to 8.2 g per DL. - Discussed extensively with patient and spouse at bedside regarding options of Iron  replacement i.e. oral versus IV, risks and benefits. They opted to proceed with IV iron replacement per pharmacy. Patient was treated with IV iron. Continue oral iron supplements at discharge. Patient and spouse advised to use OTC stool softeners as needed for constipation and counseled regarding dark discoloration of stools from oral iron. - Advised patient to consider outpatient GYN consultation if decision to continue anticoagulation was made. - Anemia panel: Iron 10, ferritin 3.7, saturation ratio 2.2, B12: 346.  Menorrhagia - Prior to anticoagulation, she stated that she used to have 3 days of mild menstrual bleeding. Since anticoagulation, she has 7 days of heavy menstrual bleeding and has to change pads frequently and has blood clots. - As stated above, if decision made to continue anticoagulation, recommend outpatient GYN consultation for definitive treatment for menorrhagia. - LMP 07/22/15.  VTE on Xarelto - She developed first left lower extremity DVT in February 2016 following a 26 hour car ride from [place]. She took anticoagulation only for 1 month and then stopped by herself.  - Had left lower extremity DVT and PE in October 2016 and has been on Xarelto since. As per initial plans, plan was to continue anticoagulation for 6 months, reevaluate with left lower extremity venous Doppler and if negative, plan was to discontinue anticoagulation.  - Patient has had 2 separate episodes of VTE and has family history of DVT in her father. She may have a hypercoagulable state and hence may need to continue anticoagulation for long-term. Recommend outpatient hematology consultation to evaluate for hypercoagulable state and GYN consultation to treat menorrhagia so she can be anticoagulated.  -  This was discussed in detail with patient and her spouse at bedside and they were advised to follow-up with PCP upon discharge.    Consultants:   None  Procedures:   07/31/15 Vascular Ultrasound Left  lower extremity venous duplex has been completed. Preliminary findings: No evidence of DVT or baker's cyst. Previous DVT appears to have resolved.   Discharge Exam:  Complaints: Denies complaints. No dyspnea, chest pain, palpitations, dizziness or lightheadedness.  Filed Vitals:   07/31/15 1330 07/31/15 1624 07/31/15 2115 08/01/15 0525  BP: 112/60 112/61 122/53 130/66  Pulse: 69 81 73 69  Temp: 98.2 F (36.8 C) 98.3 F (36.8 C) 98.2 F (36.8 C) 98.6 F (37 C)  TempSrc:  Oral Oral   Resp: 20 18 16 16   Height:      Weight:      SpO2: 100% 100% 99% 96%    General exam: Pleasant young female sitting up comfortably in bed. Respiratory system: Clear. No increased work of breathing. Cardiovascular system: S1 & S2 heard, RRR. No JVD, murmurs, gallops, clicks or pedal edema. Gastrointestinal system: Abdomen is nondistended, soft and nontender. Normal bowel sounds heard. Central nervous system: Alert and oriented. No focal neurological deficits. Extremities: Symmetric 5 x 5 power.  Discharge Instructions      Discharge Instructions    Activity as tolerated - No restrictions    Complete by:  As directed      Call MD for:  difficulty breathing, headache or visual disturbances    Complete by:  As directed      Call MD for:  extreme fatigue    Complete by:  As directed      Call MD for:  persistant dizziness or light-headedness    Complete by:  As directed      Call MD for:  severe uncontrolled pain    Complete by:  As directed      Diet - low sodium heart healthy    Complete by:  As directed             Medication List    STOP taking these medications        FERRALET 90 90-1 MG Tabs      TAKE these medications        acetaminophen 325 MG tablet  Commonly known as:  TYLENOL  Take 650 mg by mouth every 6 (six) hours as needed (pain).     ferrous sulfate 325 (65 FE) MG tablet  Take 1 tablet (325 mg total) by mouth 3 (three) times daily with meals.     MIDOL  COMPLETE PO  Take 2 tablets by mouth daily as needed (menstrual cramps).     rivaroxaban 20 MG Tabs tablet  Commonly known as:  XARELTO  TAKE 1 TABLET(20 MG) BY MOUTH DAILY WITH DINNER       Follow-up Information    Follow up with Mauricio Po, FNP. Schedule an appointment as soon as possible for a visit in 5 days.   Specialty:  Family Medicine   Why:  To be seen with repeat labs (CBC). Also to discuss regarding continued anticoagulation, outpatient hematology and GYN consultation.   Contact information:   [address] [place] [address] [phone]       Get Medicines reviewed and adjusted: Please take all your medications with you for your next visit with your Primary MD  Please request your Primary MD to go over all hospital tests and procedure/radiological results at the follow up.  Please ask your Primary MD to get all Hospital records sent to his/her office.  If you experience worsening of your admission symptoms, develop shortness of breath, life threatening emergency, suicidal or homicidal thoughts you must seek medical attention immediately by calling 911 or calling your MD immediately if symptoms less severe.  You must read complete instructions/literature along with all the possible adverse reactions/side effects for all the Medicines you take and that have been prescribed to you. Take any new Medicines after you have completely understood and accept all the possible adverse reactions/side effects.   Do not drive when taking pain medications.   Do not take more than prescribed Pain, Sleep and Anxiety Medications  Special Instructions: If you have smoked or chewed Tobacco in the last 2 yrs please stop smoking, stop any regular Alcohol and or any Recreational drug use.  Wear Seat belts while driving.  Please note  You were cared for by a hospitalist during your hospital stay. Once you are discharged, your primary care physician will handle any further medical  issues. Please note that NO REFILLS for any discharge medications will be authorized once you are discharged, as it is imperative that you return to your primary care physician (or establish a relationship with a primary care physician if you do not have one) for your aftercare needs so that they can reassess your need for medications and monitor your lab values.    The results of significant diagnostics from this hospitalization (including imaging, microbiology, ancillary and laboratory) are listed below for reference.    Significant Diagnostic Studies: No results found.  Microbiology: No results found for this or any previous visit (from the past 240 hour(s)).   Labs: Basic Metabolic Panel:  Recent Labs Lab 07/30/15 1630  NA 137  K 4.0  CL 108  CO2 21*  GLUCOSE 90  BUN 6  CREATININE 0.77  CALCIUM 8.8*   Liver Function Tests: No results for input(s): AST, ALT, ALKPHOS, BILITOT, PROT, ALBUMIN in the last 168 hours. No results for input(s): LIPASE, AMYLASE in the last 168 hours. No results for input(s): AMMONIA in the last 168 hours. CBC:  Recent Labs Lab 07/30/15 1154 07/30/15 1630 07/31/15 0440 07/31/15 1835 08/01/15 0546  WBC 5.1 5.4 5.4  --  6.7  NEUTROABS  --  2.2  --   --   --   HGB 6.7 Repeated and verified X2.* 6.6* 7.1* 8.2* 8.2*  HCT 22.9 Repeated and verified X2.* 24.0* 25.1* 28.2* 27.9*  MCV 64.3 Repeated and verified X2.* 67.6* 68.8*  --  72.1*  PLT 324.0 293 237  --  237   Cardiac Enzymes: No results for input(s): CKTOTAL, CKMB, CKMBINDEX, TROPONINI in the last 168 hours. BNP: BNP (last 3 results) No results for input(s): BNP in the last 8760 hours.  ProBNP (last 3 results) No results for input(s): PROBNP in the last 8760 hours.  CBG: No results for input(s): GLUCAP in the last 168 hours.   Discussed extensively with patient's spouse at bedside. Updated care and answered questions.  Signed:  Vernell Leep, MD, FACP, FHM. [facility] Pager [phone]  If 7PM-7AM, please contact night-coverage [email or web address] Password [email or web address] 08/01/2015, 2:11 PM

## 2015-08-06 ENCOUNTER — Other Ambulatory Visit: Payer: Self-pay | Admitting: Family

## 2015-08-09 ENCOUNTER — Encounter: Payer: Self-pay | Admitting: Family

## 2015-08-09 ENCOUNTER — Inpatient Hospital Stay: Payer: BLUE CROSS/BLUE SHIELD | Admitting: Family

## 2015-08-09 ENCOUNTER — Other Ambulatory Visit (INDEPENDENT_AMBULATORY_CARE_PROVIDER_SITE_OTHER): Payer: BLUE CROSS/BLUE SHIELD

## 2015-08-09 ENCOUNTER — Ambulatory Visit (INDEPENDENT_AMBULATORY_CARE_PROVIDER_SITE_OTHER): Payer: BLUE CROSS/BLUE SHIELD | Admitting: Family

## 2015-08-09 VITALS — BP 120/68 | HR 77 | Temp 98.5°F | Ht 64.5 in | Wt 222.0 lb

## 2015-08-09 DIAGNOSIS — N946 Dysmenorrhea, unspecified: Secondary | ICD-10-CM

## 2015-08-09 DIAGNOSIS — Z86718 Personal history of other venous thrombosis and embolism: Secondary | ICD-10-CM

## 2015-08-09 LAB — CBC WITH DIFFERENTIAL/PLATELET
Basophils Absolute: 0.1 10*3/uL (ref 0.0–0.1)
Basophils Relative: 0.9 % (ref 0.0–3.0)
EOS PCT: 2.1 % (ref 0.0–5.0)
Eosinophils Absolute: 0.1 10*3/uL (ref 0.0–0.7)
HCT: 32.5 % — ABNORMAL LOW (ref 36.0–46.0)
Hemoglobin: 10.1 g/dL — ABNORMAL LOW (ref 12.0–15.0)
LYMPHS ABS: 2 10*3/uL (ref 0.7–4.0)
Lymphocytes Relative: 29 % (ref 12.0–46.0)
MCHC: 31 g/dL (ref 30.0–36.0)
MCV: 73.9 fl — AB (ref 78.0–100.0)
MONO ABS: 0.9 10*3/uL (ref 0.1–1.0)
MONOS PCT: 12.7 % — AB (ref 3.0–12.0)
NEUTROS ABS: 3.8 10*3/uL (ref 1.4–7.7)
NEUTROS PCT: 55.3 % (ref 43.0–77.0)
PLATELETS: 269 10*3/uL (ref 150.0–400.0)
RBC: 4.39 Mil/uL (ref 3.87–5.11)
RDW: 34.5 % — AB (ref 11.5–15.5)
WBC: 6.9 10*3/uL (ref 4.0–10.5)

## 2015-08-09 NOTE — Patient Instructions (Signed)
Lets be very cautious about coming off of Xarelto.   Referral to heme.   If there is no improvement in your symptoms, or if there is any worsening of symptoms, or if you have any additional concerns, please return for re-evaluation; or, if we are closed, consider going to the Emergency Room for evaluation if symptoms urgent.

## 2015-08-09 NOTE — Progress Notes (Signed)
Subjective:    Patient ID: Sarah Hudson, female    DOB: March 30, 1966, 50 y.o.   MRN: [id number]   Sarah Hudson is a 50 y.o. female who presents today for an acute visit.    HPI Comments: Patient here for evaluation after hospital admission from PCP when hgb 6.7. She would like to stop serology tablet is causing very heavy periods. She has a history of DVTs and pulmonary embolism last year. History of menorrhagia from Xarelto, Iron deficiency anemia.   Per chart review Hospital course shows 2 units of packed red blood cells and 2 iron infusions. No chest CT done during hospital admission. 5/2 ultrasound of left LE had no DVT. At discharge, patient stopped taking Ferralet and is only on ferrous sulfate 325mg  TID.  Advised to continue her Xarelto. They also recommended a referral to hematologist and gynecology. Her last hemoglobin was 8.2 which has increased from 6.6.  DVT/PE Hx: Feb 2016 left provoked DVT- took Xarelto for 30 days Oct 2016- left DVT remained; had PE; restarted Xarelto and has stayed on it since then.   No known personal or family history of bleeding disorders.    Past Medical History  Diagnosis Date  . Hx of blood clots   . Chicken pox   . Anemia    Allergies: Review of patient's allergies indicates no known allergies. Current Outpatient Prescriptions on File Prior to Visit  Medication Sig Dispense Refill  . acetaminophen (TYLENOL) 325 MG tablet Take 650 mg by mouth every 6 (six) hours as needed (pain).    . ferrous sulfate 325 (65 FE) MG tablet Take 1 tablet (325 mg total) by mouth 3 (three) times daily with meals. 90 tablet 0  . rivaroxaban (XARELTO) 20 MG TABS tablet TAKE 1 TABLET(20 MG) BY MOUTH DAILY WITH DINNER (Patient taking differently: Take 20 mg by mouth daily with supper. ) 30 tablet 1  . XARELTO 20 MG TABS tablet TAKE 1 TABLET(20 MG) BY MOUTH DAILY WITH DINNER 30 tablet 0  . Acetaminophen-Caff-Pyrilamine (MIDOL COMPLETE Hudson) Take 2 tablets by mouth daily  as needed (menstrual cramps). Reported on 08/09/2015     No current facility-administered medications on file prior to visit.    Social History  Substance Use Topics  . Smoking status: Never Smoker   . Smokeless tobacco: Never Used  . Alcohol Use: Yes     Comment: Occasional    Review of Systems  Constitutional: Negative for fever and chills.  Respiratory: Negative for cough, shortness of breath and wheezing.   Cardiovascular: Negative for chest pain, palpitations and leg swelling.  Gastrointestinal: Negative for nausea and vomiting.      Objective:    BP 120/68 mmHg  Pulse 77  Temp(Src) 98.5 F (36.9 C) (Oral)  Ht 5' 4.5" (1.638 m)  Wt 222 lb (100.699 kg)  BMI 37.53 kg/m2  SpO2 99%  LMP 07/20/2015   Physical Exam  Constitutional: She appears well-developed and well-nourished.  Eyes: Conjunctivae are normal.  Cardiovascular: Normal rate, regular rhythm, normal heart sounds and normal pulses.   No LE edema, palpable cords or masses. No erythema or increased warmth. Negative Homan sign bilaterally.  LE hair growth symmetric and present. No discoloration of varicosities noted. LE warm and palpable pedal pulses.   Pulmonary/Chest: Effort normal and breath sounds normal. She has no wheezes. She has no rhonchi. She has no rales.  Neurological: She is alert.  Skin: Skin is warm and dry.  Psychiatric: She has a  normal mood and affect. Her speech is normal and behavior is normal. Thought content normal.  Vitals reviewed.      Assessment & Plan:  1. History of DVT (deep vein thrombosis) Hemoglobin today is 10.1, improved from 8.2 at discharge.LLE duplex negative for DVT.   I agree with patient that I would like for her to stop the Xarelto due to menorrhagia; however I would like for her to see hematology first as she had sub par treatment in 05/2014 of only 30 days on Xarelto. From her history, this appears to be a provoked DVT after a long car ride which unfortunately was  not treated for 3-6 months and resulted in PE. Standard of care,she has more than completed 3-6 months of anticoagulation at this point. DVT has resolved.   However I want to ensure there isn't underlying coagulation disorder prior to discontinuation.Have also messaged PCP and would like his opinion as well.     - CBC with Differential/Platelet; Future - Ambulatory referral to Hematology    I am having Sarah Hudson maintain her rivaroxaban, acetaminophen, Acetaminophen-Caff-Pyrilamine (MIDOL COMPLETE Hudson), ferrous sulfate, and XARELTO.   No orders of the defined types were placed in this encounter.     Start medications as prescribed and explained to patient on After Visit Summary ( AVS). Risks, benefits, and alternatives of the medications and treatment plan prescribed today were discussed, and patient expressed understanding.   Education regarding symptom management and diagnosis given to patient.   Follow-up:Plan follow-up as discussed or as needed if any worsening symptoms or change in condition.   Continue to follow with Sarah Po, FNP for routine health maintenance.   Sarah Hudson and I agreed with plan.   Sarah Paris, FNP   Total of 25 minutes spent with patient, greater than 50% of which was spent in discussion of hospitalization, h/o DVT/PE, and stopping Xarelto.

## 2015-08-09 NOTE — Progress Notes (Signed)
Pre visit review using our clinic review tool, if applicable. No additional management support is needed unless otherwise documented below in the visit note. 

## 2015-08-14 ENCOUNTER — Telehealth: Payer: Self-pay

## 2015-08-14 NOTE — Telephone Encounter (Signed)
Pt informed of the recommendation below and agreed.

## 2015-08-14 NOTE — Telephone Encounter (Signed)
-----   Message from Burnard Hawthorne, [place] sent at 08/13/2015  8:54 AM EDT ----- Fawn Kirk!  Would you call patient and let her know that her PCP Marya Amsler) and I spoke and we have decided that due to her family h/o DVTs, we would like her to see hematology for a work up prior to discontinuation of the Xarelto.   Can we expedite that appointment for her??? She is having very bad menstrual periods while on the Xarelto.   Thank you my friend!  M

## 2015-08-21 ENCOUNTER — Encounter: Payer: Self-pay | Admitting: Hematology

## 2015-08-29 ENCOUNTER — Ambulatory Visit (HOSPITAL_BASED_OUTPATIENT_CLINIC_OR_DEPARTMENT_OTHER): Payer: BLUE CROSS/BLUE SHIELD | Admitting: Hematology

## 2015-08-29 ENCOUNTER — Encounter: Payer: Self-pay | Admitting: Hematology

## 2015-08-29 ENCOUNTER — Telehealth: Payer: Self-pay | Admitting: Hematology

## 2015-08-29 VITALS — BP 128/72 | HR 86 | Temp 98.1°F | Resp 18 | Ht 64.5 in | Wt 223.3 lb

## 2015-08-29 DIAGNOSIS — N920 Excessive and frequent menstruation with regular cycle: Secondary | ICD-10-CM

## 2015-08-29 DIAGNOSIS — D500 Iron deficiency anemia secondary to blood loss (chronic): Secondary | ICD-10-CM

## 2015-08-29 DIAGNOSIS — D6859 Other primary thrombophilia: Secondary | ICD-10-CM | POA: Diagnosis not present

## 2015-08-29 DIAGNOSIS — I2699 Other pulmonary embolism without acute cor pulmonale: Secondary | ICD-10-CM

## 2015-08-29 DIAGNOSIS — D509 Iron deficiency anemia, unspecified: Secondary | ICD-10-CM

## 2015-08-29 DIAGNOSIS — I82402 Acute embolism and thrombosis of unspecified deep veins of left lower extremity: Secondary | ICD-10-CM

## 2015-08-29 NOTE — Progress Notes (Signed)
Marland Kitchen    HEMATOLOGY/ONCOLOGY CONSULTATION NOTE  Date of Service: 08/29/2015  Patient Care Team: Sarah Circle, FNP as PCP - General (Family Medicine)  CHIEF COMPLAINTS/PURPOSE OF CONSULTATION:  " I am trying to get off Xarelto"  HISTORY OF PRESENTING ILLNESS:   Sarah Hudson is a wonderful 50 y.o. 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  Patient has a PMHx significant for left lower extremity (left peroneal) DVT in feb 2016 which she reports occurred after a road trip to and from [place]. She was  was placed on Xarelto and reports that she took her Xarelto for only about 39month and discontinued it and did not followup. She reports that she was having severe menorrhagia which is another reason she stopped it. She subsequently after another road trip in Oct 2016 developed an acute LLE DVT and Rt sided PE's and was placed back on Xarelto which she has been taking since. She was admitted to the hospital in 07/2015 with symptomatic anemia with hgb of 6.7. She received 2 units of PRBC and IV iron. hgb was up to 10.1 with MCV of 73.9. Patient continues to be on oral iron. She notes that her periods continue to be heavy and wants to get off the Xarelto ASAP.  She had a rpt LLE [place] on 07/31/2015 which showed resolution of previous left peroneal DVT and no new DVT. She reports her father had a DVT but that it was in the setting of obesity, immobility and with no known thrombophilia.  In the past she has had 7 children with the youngest being 37yrs of age and has never had VTE with her pregnancies or post-partum which makes a hereditary thrombophilia less likely.  Patient currently noted no lower extremity pain/swelling and no CP or overt shortness of breath.  MEDICAL HISTORY:  Past Medical History  Diagnosis Date  . Hx of blood clots   . Chicken pox   . Anemia     SURGICAL HISTORY: Past Surgical History  Procedure Laterality  Date  . Umbilical hernia repair    . Tubal ligation      SOCIAL HISTORY: Social History   Social History  . Marital Status: Married    Spouse Name: N/A  . Number of Children: 7  . Years of Education: 16   Occupational History  . Accountant    Social History Main Topics  . Smoking status: Never Smoker   . Smokeless tobacco: Never Used  . Alcohol Use: Yes     Comment: Occasional  . Drug Use: No  . Sexual Activity: Not on file   Other Topics Concern  . Not on file   Social History Narrative   Fun: Travel   Denies religious beliefs effecting health care   Feels safe at home and denies abuse    FAMILY HISTORY: Family History  Problem Relation Age of Onset  . Arthritis Mother   . Hypertension Mother   . Diabetes Mother   . Hypertension Father   . Heart attack Maternal Grandfather     50's    ALLERGIES:  has No Known Allergies.  MEDICATIONS:  Current Outpatient Prescriptions  Medication Sig Dispense Refill  . acetaminophen (TYLENOL) 325 MG tablet Take 650 mg by mouth every 6 (six) hours as needed (pain).    . Acetaminophen-Caff-Pyrilamine (MIDOL COMPLETE PO) Take 2 tablets by mouth daily as needed (menstrual cramps). Reported on 08/09/2015    .  ferrous sulfate 325 (65 FE) MG tablet Take 1 tablet (325 mg total) by mouth 3 (three) times daily with meals. 90 tablet 0  . rivaroxaban (XARELTO) 20 MG TABS tablet TAKE 1 TABLET(20 MG) BY MOUTH DAILY WITH DINNER (Patient taking differently: Take 20 mg by mouth daily with supper. ) 30 tablet 1  . XARELTO 20 MG TABS tablet TAKE 1 TABLET(20 MG) BY MOUTH DAILY WITH DINNER 30 tablet 0   No current facility-administered medications for this visit.    REVIEW OF SYSTEMS:    10 Point review of Systems was done is negative except as noted above.  PHYSICAL EXAMINATION: ECOG PERFORMANCE STATUS: 0 - Asymptomatic  . Filed Vitals:   08/29/15 1525  BP: 128/72  Pulse: 86  Temp: 98.1 F (36.7 C)  Resp: 18   Filed Weights    08/29/15 1525  Weight: 223 lb 4.8 oz (101.288 kg)   .Body mass index is 37.75 kg/(m^2).  GENERAL:alert, in no acute distress and comfortable SKIN: skin color, texture, turgor are normal, no rashes or significant lesions EYES: normal, conjunctiva are pink and non-injected, sclera clear OROPHARYNX:no exudate, no erythema and lips, buccal mucosa, and tongue normal  NECK: supple, no JVD, thyroid normal size, non-tender, without nodularity LYMPH:  no palpable lymphadenopathy in the cervical, axillary or inguinal LUNGS: clear to auscultation with normal respiratory effort HEART: regular rate & rhythm,  no murmurs and no lower extremity edema ABDOMEN: abdomen soft, non-tender, normoactive bowel sounds  Musculoskeletal: no cyanosis of digits and no clubbing  PSYCH: alert & oriented x 3 with fluent speech NEURO: no focal motor/sensory deficits  LABORATORY DATA:  I have reviewed the data as listed  . CBC Latest Ref Rng 08/09/2015 08/01/2015 07/31/2015  WBC 4.0 - 10.5 K/uL 6.9 6.7 -  Hemoglobin 12.0 - 15.0 g/dL 10.1(L) 8.2(L) 8.2(L)  Hematocrit 36.0 - 46.0 % 32.5(L) 27.9(L) 28.2(L)  Platelets 150.0 - 400.0 K/uL 269.0 237 -    . CMP Latest Ref Rng 07/30/2015 01/06/2015 01/05/2015  Glucose 65 - 99 mg/dL 90 138(H) 140(H)  BUN 6 - 20 mg/dL 6 6 5(L)  Creatinine 0.44 - 1.00 mg/dL 0.77 0.85 0.82  Sodium 135 - 145 mmol/L 137 140 141  Potassium 3.5 - 5.1 mmol/L 4.0 3.6 3.8  Chloride 101 - 111 mmol/L 108 106 107  CO2 22 - 32 mmol/L 21(L) 25 26  Calcium 8.9 - 10.3 mg/dL 8.8(L) 8.8(L) 9.1     RADIOGRAPHIC STUDIES: I have personally reviewed the radiological images as listed and agreed with the findings in the report. No results found.  ASSESSMENT & PLAN:   50 yo with with   1) LLE DVT in 05/2014 provoked by an extensive car ride to [place] and back. Patient completed only 1 month of Xarelto and stop it due to menorhagia and decision not to followup for this  2) Acute LLE and Rt sided PE in 12/2014  - likely clot progression due to incompletely treated previous clot and repeat long distance car journey.  Rpt [place] LLE on 07/31/2015 shows no evidence of residual clot Unclear h/o VTE in father in the setting of other acquired risk factors. Patient is having severe menorrhagia and has required hospitalization for symptomatic anemia - leading to PRBC transfusion and IV iron in 07/2015. 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 -patient is decided that she wants to get off anticoagulation despite the results of hypercoag workup and therefore there was a decision not to proceed with hypercoag since it would not change decision regarging anticoagulation. -in keeping with patient preference could discontinue Xarelto and switch to ASA 81mg  po daily for life time for VTE prophylaxis. -patient counseled on weight loss and maintaining active lifestyle and avoiding situation causing dehydration or leading to increased risk of VTE. -continue po iron -consider gyn evaluation if menorrhea persistents despite cessation of anticoagulation. RTC to her PCP.  All of the patients questions were answered to her apparent satisfaction. The patient knows to call the clinic with any problems, questions or concerns.  I spent 60 minutes counseling the patient face to face. The total time spent in the appointment was 60 minutes and more than 50% was on counseling and direct patient cares.    Sullivan Lone MD [place] AAHIVMS [facility] Hematology/Oncology Physician [facility]  (Office):       [phone] (Work cell):  [phone] (Fax):           [phone]  08/29/2015 3:16 PM

## 2015-08-29 NOTE — Telephone Encounter (Signed)
Gave pt avs °

## 2015-09-19 DIAGNOSIS — D6859 Other primary thrombophilia: Secondary | ICD-10-CM | POA: Insufficient documentation

## 2017-10-20 ENCOUNTER — Emergency Department (HOSPITAL_COMMUNITY)
Admission: EM | Admit: 2017-10-20 | Discharge: 2017-10-20 | Disposition: A | Discharge status: Home / Self Care | Payer: BLUE CROSS/BLUE SHIELD | Attending: Emergency Medicine | Admitting: Emergency Medicine

## 2017-10-20 ENCOUNTER — Emergency Department (HOSPITAL_BASED_OUTPATIENT_CLINIC_OR_DEPARTMENT_OTHER): Payer: BLUE CROSS/BLUE SHIELD | Admitting: Anesthesiology

## 2017-10-20 ENCOUNTER — Ambulatory Visit (HOSPITAL_BASED_OUTPATIENT_CLINIC_OR_DEPARTMENT_OTHER): Admit: 2017-10-20 | Payer: BLUE CROSS/BLUE SHIELD | Admitting: Orthopedic Surgery

## 2017-10-20 ENCOUNTER — Other Ambulatory Visit: Payer: Self-pay | Admitting: Orthopedic Surgery

## 2017-10-20 ENCOUNTER — Emergency Department (HOSPITAL_COMMUNITY): Payer: BLUE CROSS/BLUE SHIELD

## 2017-10-20 ENCOUNTER — Encounter (HOSPITAL_BASED_OUTPATIENT_CLINIC_OR_DEPARTMENT_OTHER): Admission: EM | Disposition: A | Discharge status: Home / Self Care | Payer: Self-pay | Source: Home / Self Care

## 2017-10-20 ENCOUNTER — Other Ambulatory Visit: Payer: Self-pay

## 2017-10-20 ENCOUNTER — Encounter (HOSPITAL_COMMUNITY): Payer: Self-pay | Admitting: Emergency Medicine

## 2017-10-20 DIAGNOSIS — M79642 Pain in left hand: Secondary | ICD-10-CM | POA: Diagnosis present

## 2017-10-20 DIAGNOSIS — Z86711 Personal history of pulmonary embolism: Secondary | ICD-10-CM | POA: Insufficient documentation

## 2017-10-20 DIAGNOSIS — Z86718 Personal history of other venous thrombosis and embolism: Secondary | ICD-10-CM | POA: Diagnosis not present

## 2017-10-20 DIAGNOSIS — L03012 Cellulitis of left finger: Secondary | ICD-10-CM | POA: Insufficient documentation

## 2017-10-20 DIAGNOSIS — I739 Peripheral vascular disease, unspecified: Secondary | ICD-10-CM | POA: Insufficient documentation

## 2017-10-20 HISTORY — PX: INCISION AND DRAINAGE: SHX5863

## 2017-10-20 LAB — CBC WITH DIFFERENTIAL/PLATELET
Abs Immature Granulocytes: 0 10*3/uL (ref 0.0–0.1)
Basophils Absolute: 0.1 10*3/uL (ref 0.0–0.1)
Basophils Relative: 1 %
EOS ABS: 0.1 10*3/uL (ref 0.0–0.7)
EOS PCT: 2 %
HEMATOCRIT: 41.7 % (ref 36.0–46.0)
HEMOGLOBIN: 13.2 g/dL (ref 12.0–15.0)
IMMATURE GRANULOCYTES: 0 %
LYMPHS ABS: 1.4 10*3/uL (ref 0.7–4.0)
Lymphocytes Relative: 27 %
MCH: 28.9 pg (ref 26.0–34.0)
MCHC: 31.7 g/dL (ref 30.0–36.0)
MCV: 91.2 fL (ref 78.0–100.0)
MONOS PCT: 11 %
Monocytes Absolute: 0.6 10*3/uL (ref 0.1–1.0)
NEUTROS PCT: 59 %
Neutro Abs: 3 10*3/uL (ref 1.7–7.7)
Platelets: 241 10*3/uL (ref 150–400)
RBC: 4.57 MIL/uL (ref 3.87–5.11)
RDW: 15 % (ref 11.5–15.5)
WBC: 5.1 10*3/uL (ref 4.0–10.5)

## 2017-10-20 LAB — BASIC METABOLIC PANEL
Anion gap: 5 (ref 5–15)
BUN: 9 mg/dL (ref 6–20)
CHLORIDE: 107 mmol/L (ref 98–111)
CO2: 26 mmol/L (ref 22–32)
CREATININE: 1.03 mg/dL — AB (ref 0.44–1.00)
Calcium: 9.3 mg/dL (ref 8.9–10.3)
GFR calc Af Amer: >60 mL/min (ref >60)
GFR calc non Af Amer: >60 mL/min (ref >60)
Glucose, Bld: 85 mg/dL (ref 70–99)
Potassium: 4.6 mmol/L (ref 3.5–5.1)
Sodium: 138 mmol/L (ref 135–145)

## 2017-10-20 SURGERY — INCISION AND DRAINAGE
Anesthesia: General | Site: Finger | Laterality: Left

## 2017-10-20 MED ORDER — OXYCODONE HCL 5 MG/5ML PO SOLN: 5.0000 mg | Freq: Once | ORAL | Status: AC | PRN

## 2017-10-20 MED ORDER — 0.9 % SODIUM CHLORIDE (POUR BTL) OPTIME
TOPICAL | Status: DC | PRN
  Administered 2017-10-20: 120 mL

## 2017-10-20 MED ORDER — OXYCODONE HCL 5 MG PO TABS
ORAL_TABLET | ORAL | Status: AC
  Filled 2017-10-20: qty 1

## 2017-10-20 MED ORDER — CEFAZOLIN SODIUM-DEXTROSE 2-3 GM-%(50ML) IV SOLR
INTRAVENOUS | Status: DC | PRN
  Administered 2017-10-20: 2 g via INTRAVENOUS

## 2017-10-20 MED ORDER — FENTANYL CITRATE (PF) 100 MCG/2ML IJ SOLN
INTRAMUSCULAR | Status: AC
  Filled 2017-10-20: qty 2

## 2017-10-20 MED ORDER — FENTANYL CITRATE (PF) 100 MCG/2ML IJ SOLN
50.0000 ug | INTRAMUSCULAR | Status: AC | PRN
  Administered 2017-10-20: 100 ug via INTRAVENOUS
  Administered 2017-10-20 (×2): 50 ug via INTRAVENOUS

## 2017-10-20 MED ORDER — FENTANYL CITRATE (PF) 100 MCG/2ML IJ SOLN
25.0000 ug | INTRAMUSCULAR | Status: DC | PRN
  Administered 2017-10-20 (×2): 50 ug via INTRAVENOUS

## 2017-10-20 MED ORDER — CEFAZOLIN SODIUM-DEXTROSE 2-4 GM/100ML-% IV SOLN
INTRAVENOUS | Status: AC
  Filled 2017-10-20: qty 100

## 2017-10-20 MED ORDER — SULFAMETHOXAZOLE-TRIMETHOPRIM 800-160 MG PO TABS: 1.0000 | ORAL_TABLET | Freq: Two times a day (BID) | ORAL | 0 refills | Status: DC

## 2017-10-20 MED ORDER — CHLORHEXIDINE GLUCONATE 4 % EX LIQD: 60.0000 mL | Freq: Once | CUTANEOUS | Status: DC

## 2017-10-20 MED ORDER — BUPIVACAINE HCL (PF) 0.25 % IJ SOLN
10.0000 mL | Freq: Once | INTRAMUSCULAR | Status: DC
  Filled 2017-10-20: qty 30

## 2017-10-20 MED ORDER — MIDAZOLAM HCL 2 MG/2ML IJ SOLN
INTRAMUSCULAR | Status: AC
  Filled 2017-10-20: qty 2

## 2017-10-20 MED ORDER — BUPIVACAINE HCL (PF) 0.25 % IJ SOLN
INTRAMUSCULAR | Status: DC | PRN
  Administered 2017-10-20: 10 mL

## 2017-10-20 MED ORDER — OXYCODONE HCL 5 MG PO TABS
5.0000 mg | ORAL_TABLET | Freq: Once | ORAL | Status: AC | PRN
  Administered 2017-10-20: 5 mg via ORAL

## 2017-10-20 MED ORDER — HYDROCODONE-ACETAMINOPHEN 5-325 MG PO TABS
1.0000 | ORAL_TABLET | Freq: Once | ORAL | Status: AC
  Administered 2017-10-20: 1 via ORAL
  Filled 2017-10-20: qty 1

## 2017-10-20 MED ORDER — MIDAZOLAM HCL 2 MG/2ML IJ SOLN
1.0000 mg | INTRAMUSCULAR | Status: DC | PRN
  Administered 2017-10-20: 2 mg via INTRAVENOUS

## 2017-10-20 MED ORDER — LIDOCAINE 2% (20 MG/ML) 5 ML SYRINGE
INTRAMUSCULAR | Status: DC | PRN
  Administered 2017-10-20: 70 mg via INTRAVENOUS

## 2017-10-20 MED ORDER — ONDANSETRON HCL 4 MG/2ML IJ SOLN: 4.0000 mg | Freq: Four times a day (QID) | INTRAMUSCULAR | Status: DC | PRN

## 2017-10-20 MED ORDER — LACTATED RINGERS IV SOLN
INTRAVENOUS | Status: DC
  Administered 2017-10-20: 13:00:00 via INTRAVENOUS

## 2017-10-20 MED ORDER — SCOPOLAMINE 1 MG/3DAYS TD PT72: 1.0000 | MEDICATED_PATCH | Freq: Once | TRANSDERMAL | Status: DC | PRN

## 2017-10-20 MED ORDER — BUPIVACAINE HCL (PF) 0.25 % IJ SOLN
INTRAMUSCULAR | Status: AC
  Filled 2017-10-20: qty 30

## 2017-10-20 MED ORDER — HYDROCODONE-ACETAMINOPHEN 5-325 MG PO TABS: ORAL_TABLET | ORAL | 0 refills | Status: DC

## 2017-10-20 SURGICAL SUPPLY — 51 items
BAG DECANTER FOR FLEXI CONT (MISCELLANEOUS) IMPLANT
BANDAGE ACE 3X5.8 VEL STRL LF (GAUZE/BANDAGES/DRESSINGS) IMPLANT
BLADE MINI RND TIP GREEN BEAV (BLADE) IMPLANT
BLADE SURG 15 STRL LF DISP TIS (BLADE) ×2 IMPLANT
BLADE SURG 15 STRL SS (BLADE) ×4
BNDG COHESIVE 1X5 TAN STRL LF (GAUZE/BANDAGES/DRESSINGS) ×3 IMPLANT
BNDG ELASTIC 2X5.8 VLCR STR LF (GAUZE/BANDAGES/DRESSINGS) IMPLANT
BNDG ESMARK 4X9 LF (GAUZE/BANDAGES/DRESSINGS) ×3 IMPLANT
BNDG GAUZE 1X2.1 STRL (MISCELLANEOUS) IMPLANT
BNDG GAUZE ELAST 4 BULKY (GAUZE/BANDAGES/DRESSINGS) IMPLANT
CHLORAPREP W/TINT 26ML (MISCELLANEOUS) ×3 IMPLANT
CORD BIPOLAR FORCEPS 12FT (ELECTRODE) IMPLANT
COVER BACK TABLE 60X90IN (DRAPES) ×3 IMPLANT
COVER MAYO STAND STRL (DRAPES) ×3 IMPLANT
CUFF TOURNIQUET SINGLE 18IN (TOURNIQUET CUFF) ×3 IMPLANT
DRAPE EXTREMITY T 121X128X90 (DRAPE) ×3 IMPLANT
DRAPE SURG 17X23 STRL (DRAPES) ×3 IMPLANT
GAUZE PACKING IODOFORM 1/4X15 (GAUZE/BANDAGES/DRESSINGS) ×3 IMPLANT
GAUZE SPONGE 4X4 12PLY STRL (GAUZE/BANDAGES/DRESSINGS) ×3 IMPLANT
GAUZE XEROFORM 1X8 LF (GAUZE/BANDAGES/DRESSINGS) ×3 IMPLANT
GLOVE BIO SURGEON STRL SZ7 (GLOVE) ×3 IMPLANT
GLOVE BIO SURGEON STRL SZ7.5 (GLOVE) ×3 IMPLANT
GLOVE BIOGEL PI IND STRL 7.0 (GLOVE) ×2 IMPLANT
GLOVE BIOGEL PI IND STRL 8 (GLOVE) ×1 IMPLANT
GLOVE BIOGEL PI INDICATOR 7.0 (GLOVE) ×4
GLOVE BIOGEL PI INDICATOR 8 (GLOVE) ×2
GOWN STRL REUS W/ TWL LRG LVL3 (GOWN DISPOSABLE) ×1 IMPLANT
GOWN STRL REUS W/TWL LRG LVL3 (GOWN DISPOSABLE) ×2
GOWN STRL REUS W/TWL XL LVL3 (GOWN DISPOSABLE) ×3 IMPLANT
LOOP VESSEL MAXI BLUE (MISCELLANEOUS) IMPLANT
NEEDLE BLUNT 17GA (NEEDLE) IMPLANT
NEEDLE HYPO 25X1 1.5 SAFETY (NEEDLE) ×3 IMPLANT
NS IRRIG 1000ML POUR BTL (IV SOLUTION) ×3 IMPLANT
PACK BASIN DAY SURGERY FS (CUSTOM PROCEDURE TRAY) ×3 IMPLANT
PAD CAST 3X4 CTTN HI CHSV (CAST SUPPLIES) IMPLANT
PADDING CAST ABS 4INX4YD NS (CAST SUPPLIES)
PADDING CAST ABS COTTON 4X4 ST (CAST SUPPLIES) IMPLANT
PADDING CAST COTTON 3X4 STRL (CAST SUPPLIES)
SPLINT FINGER 3.25 911903 (SOFTGOODS) ×3 IMPLANT
SPLINT PLASTER CAST XFAST 3X15 (CAST SUPPLIES) IMPLANT
SPLINT PLASTER XTRA FASTSET 3X (CAST SUPPLIES)
STOCKINETTE 4X48 STRL (DRAPES) ×3 IMPLANT
SUT ETHILON 4 0 PS 2 18 (SUTURE) IMPLANT
SWAB COLLECTION DEVICE MRSA (MISCELLANEOUS) ×3 IMPLANT
SWAB CULTURE ESWAB REG 1ML (MISCELLANEOUS) ×3 IMPLANT
SYR 20CC LL (SYRINGE) IMPLANT
SYR BULB 3OZ (MISCELLANEOUS) ×3 IMPLANT
SYR CONTROL 10ML LL (SYRINGE) ×3 IMPLANT
TOWEL GREEN STERILE FF (TOWEL DISPOSABLE) ×6 IMPLANT
TUBE FEEDING ENTERAL 5FR 16IN (TUBING) IMPLANT
UNDERPAD 30X30 (UNDERPADS AND DIAPERS) ×3 IMPLANT

## 2017-10-20 NOTE — Anesthesia Preprocedure Evaluation (Signed)
Anesthesia Evaluation  Patient identified by MRN, date of birth, ID band Patient awake    Reviewed: Allergy & Precautions, H&P , NPO status , Patient's Chart, lab work & pertinent test results  Airway Mallampati: II   Neck ROM: full    Dental   Pulmonary neg pulmonary ROS,    breath sounds clear to auscultation       Cardiovascular + Peripheral Vascular Disease and + DVT   Rhythm:regular Rate:Normal     Neuro/Psych    GI/Hepatic   Endo/Other  obese  Renal/GU      Musculoskeletal   Abdominal   Peds  Hematology   Anesthesia Other Findings   Reproductive/Obstetrics                             Anesthesia Physical Anesthesia Plan  ASA: II  Anesthesia Plan: General   Post-op Pain Management:    Induction: Intravenous  PONV Risk Score and Plan: 3 and Ondansetron, Dexamethasone, Midazolam and Treatment may vary due to age or medical condition  Airway Management Planned: LMA  Additional Equipment:   Intra-op Plan:   Post-operative Plan:   Informed Consent: I have reviewed the patients History and Physical, chart, labs and discussed the procedure including the risks, benefits and alternatives for the proposed anesthesia with the patient or authorized representative who has indicated his/her understanding and acceptance.     Plan Discussed with: CRNA, Anesthesiologist and Surgeon  Anesthesia Plan Comments:         Anesthesia Quick Evaluation

## 2017-10-20 NOTE — ED Provider Notes (Signed)
[facility] EMERGENCY DEPARTMENT Provider Note   CSN: [id number] Arrival date & time: 10/20/17  3500     History   Chief Complaint Chief Complaint  Patient presents with  . Hand Pain    HPI Sarah Hudson is a 52 y.o. female with PMH/o Anemia, DVT who presents for evaluation of left 3rd digit pain and swelling that began approximately 5 days ago. Patient reports that initially she had some redness, swelling to the lateral edge of the nail. She states that it was draining purulent material and was becoming increasingly more painful. Patient reports that she went to Urgent Care where she states the area was drained and she was started on Bactrim, which she has been taking for the last 5 days. Patient reports that she has also been using warm soaks with minimal improvement. She states that since the draining, the area has gotten progressively more painful and swollen. She reports pain is worse with movement. She states she has been taking OTC NSAIDs for pain relief with minimal improvement. Patient denies fevers, numbness/weakness.   The history is provided by the patient.    Past Medical History:  Diagnosis Date  . Anemia   . Chicken pox   . Hx of blood clots     Patient Active Problem List   Diagnosis Date Noted  . Hypercoagulable state ([place]) 09/19/2015  . Chronic anticoagulation 07/30/2015  . Menorrhagia 07/30/2015  . Symptomatic anemia 07/30/2015  . Pulmonary embolism ([place]) 01/05/2015  . DVT (deep venous thrombosis) ([place]) 01/05/2015  . Iron (Fe) deficiency anemia 01/05/2015  . Numbness of feet 12/13/2014    Past Surgical History:  Procedure Laterality Date  . TUBAL LIGATION    . UMBILICAL HERNIA REPAIR       OB History   None      Home Medications    Prior to Admission medications   Medication Sig Start Date End Date Taking? Authorizing Provider  acetaminophen (TYLENOL) 325 MG tablet Take 650 mg by mouth every 6 (six) hours as needed (pain).    Yes [provider]  Acetaminophen-Caff-Pyrilamine (MIDOL COMPLETE PO) Take 2 tablets by mouth daily as needed (menstrual cramps). Reported on 08/09/2015   Yes [provider]  sulfamethoxazole-trimethoprim (BACTRIM DS,SEPTRA DS) 800-160 MG tablet Take 1 tablet by mouth 2 (two) times daily.   Yes [provider]  ferrous sulfate 325 (65 FE) MG tablet Take 1 tablet (325 mg total) by mouth 3 (three) times daily with meals. Patient not taking: Reported on 10/20/2017 08/01/15   Modena Jansky, MD  XARELTO 20 MG TABS tablet TAKE 1 TABLET(20 MG) BY MOUTH DAILY WITH DINNER Patient not taking: Reported on 10/20/2017 08/06/15   Golden Circle, FNP    Family History Family History  Problem Relation Age of Onset  . Arthritis Mother   . Hypertension Mother   . Diabetes Mother   . Hypertension Father   . Heart attack Maternal Grandfather        50's    Social History Social History   Tobacco Use  . Smoking status: Never Smoker  . Smokeless tobacco: Never Used  Substance Use Topics  . Alcohol use: Yes    Comment: Occasional  . Drug use: No     Allergies   Patient has no known allergies.   Review of Systems Review of Systems  Constitutional: Negative for fever.  Skin: Positive for color change and wound.  Neurological: Negative for weakness and numbness.  All other systems reviewed and are negative.    Physical Exam Updated Vital Signs BP (!) 154/88 (BP Location: Right Arm)   Pulse 73   Temp 98.5 F (36.9 C) (Oral)   Resp 18   Ht 5\' 4"  (1.626 m)   Wt 96.2 kg (212 lb)   LMP  (LMP Unknown)   SpO2 100%   BMI 36.39 kg/m   Physical Exam  Constitutional: She appears well-developed and well-nourished.  HENT:  Head: Normocephalic and atraumatic.  Eyes: Conjunctivae and EOM are normal. Right eye exhibits no discharge. Left eye exhibits no discharge. No scleral icterus.  Cardiovascular:  Pulses:      Radial pulses are 2+ on the right side, and 2+  on the left side.  Pulmonary/Chest: Effort normal.  Musculoskeletal:  Tenderness to palpation to the distal tip, lateral nailbed and pulp of the left 3rd finger with overlying erythema and edema that extends proximally just to the PIP. Pain reported with flexion.   Neurological: She is alert.  Sensation intact along major nerve distributions of BUE  Skin: Skin is warm and dry. Capillary refill takes less than 2 seconds.  Good distal cap refill. Left 3rd digit is not dusky in appearance or cool to touch. Diffuse erythema and edema noted to the lateral nail bed that extends into the pulp space and proximally to the mid digit.   Psychiatric: She has a normal mood and affect. Her speech is normal and behavior is normal.  Nursing note and vitals reviewed.    ED Treatments / Results  Labs (all labs ordered are listed, but only abnormal results are displayed) Labs Reviewed  BASIC METABOLIC PANEL - Abnormal; Notable for the following components:      Result Value   Creatinine, Ser 1.03 (*)    All other components within normal limits  CBC WITH DIFFERENTIAL/PLATELET    EKG None  Radiology Dg Finger Middle Left  Result Date: 10/20/2017 CLINICAL DATA:  Infective hang nail. Recent treatment. Infection worsening. EXAM: LEFT MIDDLE FINGER 2+V COMPARISON:  No recent prior. FINDINGS: Soft tissue swelling. No radiopaque foreign body. Diffuse degenerative change. Tiny corticated bony density noted adjacent to the distal interphalangeal joint is most likely degenerative. No erosive abnormality. IMPRESSION: Soft tissue swelling. No radiopaque foreign body. Diffuse degenerative change. No acute bony abnormality. Electronically Signed   By: Marcello Moores  Register   On: 10/20/2017 07:17    Procedures Procedures (including critical care time)  Medications Ordered in ED Medications  bupivacaine (PF) (MARCAINE) 0.25 % injection 10 mL (has no administration in time range)  HYDROcodone-acetaminophen  (NORCO/VICODIN) 5-325 MG per tablet 1 tablet (1 tablet Oral Given 10/20/17 0744)     Initial Impression / Assessment and Plan / ED Course  I have reviewed the triage vital signs and the nursing notes.  Pertinent labs & imaging results that were available during my care of the patient were reviewed by me and considered in my medical decision making (see chart for details).     52 y.o. F who presents for evaluation of left third digit pain, erythema, swelling has been ongoing for last 5 days.  Patient reports an onset of symptoms, she went to urgent care where she had the area drained.  Patient reports she was discharged home with Bactrim which she states she has been taking.  Reports since then, she has had worsening pain, swelling, redness to the finger. Patient is afebril, non-toxic appearing, sitting comfortably on examination table. Vital signs reviewed and stable.  Patient is neurovascularly intact.  On exam, she has erythema, edema noted to the lateral nailbed that extends to the pulp and extends proximally toward the middle finger.  She does have evidence of a small paronychia but question that this is not concerning the pulp and extending into a felon.  She had an incision and drainage performed in urgent care when symptoms initially began. She does have flexion/extension but does report some subjective pain.  No diffuse swelling.  Consider felon and paronychia.  History/physical exam is not concerning for flexor tenosynovitis.  X-ray ordered at triage.  Analgesics provided.  X-ray shows diffuse soft tissue swelling.  No other acute abnormalities.  Given that this is patient's second encounter for her symptoms, will plan for consult hand.  Discussed patient with Dr. Fredna Dow (Hand).  Will take patient to the OR for I&D given that this is her second visit for drainage. Updated patient and family on plan. They are agreeable.   Discussed with Dr. Fredna Dow.  Patient will go to the OR at [place] at  approximately 11 AM today. Patient aware.   Final Clinical Impressions(s) / ED Diagnoses   Final diagnoses:  Felon of finger of left hand    ED Discharge Orders    None       Desma Mcgregor 10/20/17 1220    Veryl Speak, MD 10/21/17 0600

## 2017-10-20 NOTE — ED Triage Notes (Signed)
Pt reports continued swelling to L middle finger, reports she has had an infection there for some time, seen at [facility] and drained. Reports taking antibiotics as prescribed but continuing to swell and has become more painful. No fevers at this time.

## 2017-10-20 NOTE — Anesthesia Procedure Notes (Signed)
Procedure Name: LMA Insertion Date/Time: 10/20/2017 2:33 PM Performed by: Maryella Shivers, CRNA Pre-anesthesia Checklist: Patient identified, Emergency Drugs available, Suction available and Patient being monitored Patient Re-evaluated:Patient Re-evaluated prior to induction Oxygen Delivery Method: Circle system utilized Preoxygenation: Pre-oxygenation with 100% oxygen Induction Type: IV induction Ventilation: Mask ventilation without difficulty LMA: LMA inserted LMA Size: 4.0 Number of attempts: 1 Airway Equipment and Method: Bite block Placement Confirmation: positive ETCO2 Tube secured with: Tape Dental Injury: Teeth and Oropharynx as per pre-operative assessment

## 2017-10-20 NOTE — ED Notes (Signed)
Pt  Sent with husband to Day surgery on [address] via car

## 2017-10-20 NOTE — ED Notes (Signed)
Pt transported to xray 

## 2017-10-20 NOTE — H&P (Signed)
Sarah Hudson is an 52 y.o. female.   Chief Complaint: Left long finger infection HPI: 52 year old right-hand-dominant female states she is had issues of the left long finger over the past week.  This is been drained at urgent care without resolution.  She is continued to have swelling erythema and pain of the finger.  She describes a throbbing pain and rates it at 7 or 8 out of 10 in severity.  It is alleviated with elevation and aggravated by contact.  Case discussed with Providence Lanius, PA-C and her note from 10/20/2017 reviewed. Xrays viewed and interpreted by me: AP lateral oblique views of the finger show no fracture dislocation or radiopaque foreign body Labs reviewed: White blood count 5.1  Allergies: No Known Allergies  Past Medical History:  Diagnosis Date  . Anemia   . Chicken pox   . Hx of blood clots     Past Surgical History:  Procedure Laterality Date  . TUBAL LIGATION    . UMBILICAL HERNIA REPAIR      Family History: Family History  Problem Relation Age of Onset  . Arthritis Mother   . Hypertension Mother   . Diabetes Mother   . Hypertension Father   . Heart attack Maternal Grandfather        50's    Social History:   reports that she has never smoked. She has never used smokeless tobacco. She reports that she drinks alcohol. She reports that she does not use drugs.  Medications: Medications Prior to Admission  Medication Sig Dispense Refill  . HYDROcodone-acetaminophen (NORCO/VICODIN) 5-325 MG tablet Take 1 tablet by mouth every 6 (six) hours as needed for moderate pain.    Marland Kitchen ibuprofen (ADVIL,MOTRIN) 600 MG tablet Take 600 mg by mouth every 6 (six) hours as needed.    . sulfamethoxazole-trimethoprim (BACTRIM DS,SEPTRA DS) 800-160 MG tablet Take 1 tablet by mouth 2 (two) times daily.    Marland Kitchen acetaminophen (TYLENOL) 325 MG tablet Take 650 mg by mouth every 6 (six) hours as needed (pain).    . Acetaminophen-Caff-Pyrilamine (MIDOL COMPLETE PO) Take 2 tablets by  mouth daily as needed (menstrual cramps). Reported on 08/09/2015    . ferrous sulfate 325 (65 FE) MG tablet Take 1 tablet (325 mg total) by mouth 3 (three) times daily with meals. (Patient not taking: Reported on 10/20/2017) 90 tablet 0  . XARELTO 20 MG TABS tablet TAKE 1 TABLET(20 MG) BY MOUTH DAILY WITH DINNER (Patient not taking: Reported on 10/20/2017) 30 tablet 0    Results for orders placed or performed during the hospital encounter of 10/20/17 (from the past 48 hour(s))  Basic metabolic panel     Status: Abnormal   Collection Time: 10/20/17 10:07 AM  Result Value Ref Range   Sodium 138 135 - 145 mmol/L   Potassium 4.6 3.5 - 5.1 mmol/L   Chloride 107 98 - 111 mmol/L   CO2 26 22 - 32 mmol/L   Glucose, Bld 85 70 - 99 mg/dL   BUN 9 6 - 20 mg/dL   Creatinine, Ser 1.03 (H) 0.44 - 1.00 mg/dL   Calcium 9.3 8.9 - 10.3 mg/dL   GFR calc non Af Amer >60 >60 mL/min   GFR calc Af Amer >60 >60 mL/min    Comment: (NOTE) The eGFR has been calculated using the CKD EPI equation. This calculation has not been validated in all clinical situations. eGFR's persistently <60 mL/min signify possible Chronic Kidney Disease.    Anion gap 5 5 -  15    Comment: Performed at [facility], [place] [address], [place], [place] [address]  CBC with Differential     Status: None   Collection Time: 10/20/17 10:07 AM  Result Value Ref Range   WBC 5.1 4.0 - 10.5 K/uL   RBC 4.57 3.87 - 5.11 MIL/uL   Hemoglobin 13.2 12.0 - 15.0 g/dL   HCT 41.7 36.0 - 46.0 %   MCV 91.2 78.0 - 100.0 fL   MCH 28.9 26.0 - 34.0 pg   MCHC 31.7 30.0 - 36.0 g/dL   RDW 15.0 11.5 - 15.5 %   Platelets 241 150 - 400 K/uL   Neutrophils Relative % 59 %   Neutro Abs 3.0 1.7 - 7.7 K/uL   Lymphocytes Relative 27 %   Lymphs Abs 1.4 0.7 - 4.0 K/uL   Monocytes Relative 11 %   Monocytes Absolute 0.6 0.1 - 1.0 K/uL   Eosinophils Relative 2 %   Eosinophils Absolute 0.1 0.0 - 0.7 K/uL   Basophils Relative 1 %   Basophils Absolute 0.1 0.0 - 0.1  K/uL   Immature Granulocytes 0 %   Abs Immature Granulocytes 0.0 0.0 - 0.1 K/uL    Comment: Performed at [facility], [address] [place], [place] [address]    Dg Finger Middle Left  Result Date: 10/20/2017 CLINICAL DATA:  Infective hang nail. Recent treatment. Infection worsening. EXAM: LEFT MIDDLE FINGER 2+V COMPARISON:  No recent prior. FINDINGS: Soft tissue swelling. No radiopaque foreign body. Diffuse degenerative change. Tiny corticated bony density noted adjacent to the distal interphalangeal joint is most likely degenerative. No erosive abnormality. IMPRESSION: Soft tissue swelling. No radiopaque foreign body. Diffuse degenerative change. No acute bony abnormality. Electronically Signed   By: Marcello Moores  Register   On: 10/20/2017 07:17     A comprehensive review of systems was negative. Review of Systems: No fevers, chills, night sweats, chest pain, shortness of breath, nausea, vomiting, diarrhea, constipation, easy bleeding or bruising, headaches, dizziness, vision changes, fainting.   Blood pressure 122/69, pulse 67, temperature 98 F (36.7 C), temperature source Oral, resp. rate 18, height 5' 4"  (1.626 m), weight 97.6 kg (215 lb 2 oz), last menstrual period 09/02/2017, SpO2 100 %.  General appearance: alert, cooperative and appears stated age Head: Normocephalic, without obvious abnormality, atraumatic Neck: supple, symmetrical, trachea midline Resp: clear to auscultation bilaterally Cardio: regular rate and rhythm Extremities: Intact sensation capillary refill all fingertips to the exception of the left long finger where she notes some decreased sensation in the pad of the finger.  Pad is swollen and erythematous.  There is purulence under the distal aspect of the nail.  There is evidence of a previous incision at the radial side without drainage.  She is not tender over the middle phalanx.  No proximal streaking.  She is able to flex and extend at the DIP joint  lightly. Pulses: 2+ and symmetric Skin: Skin color, texture, turgor normal. No rashes or lesions Neurologic: Grossly normal Incision/Wound: As above  Assessment/Plan Left long finger abscess.  Recommended incision and drainage in the operating room.  We will also likely remove the nail due to the evidence of infection underneath the nail.  Risks, benefits and alternatives of surgery were discussed including risks of blood loss, infection, damage to nerves/vessels/tendons/ligament/bone, failure of surgery, need for additional surgery, complication with wound healing, stiffness.  She voiced understanding of these risks and elected to proceed.    Nicholson Starace R 10/20/2017, 2:10 PM

## 2017-10-20 NOTE — ED Notes (Signed)
Pt aware she must undressed and valuables needed to be given to husb and

## 2017-10-20 NOTE — Transfer of Care (Signed)
Immediate Anesthesia Transfer of Care Note  Patient: Sarah Hudson  Procedure(s) Performed: INCISION AND DRAINAGE LEFT LONG FINGER (Left Finger)  Patient Location: PACU  Anesthesia Type:General  [place] of Consciousness: sedated  Airway & Oxygen Therapy: Patient Spontanous Breathing and Patient connected to face mask oxygen  Post-op Assessment: Report given to RN and Post -op Vital signs reviewed and stable  Post vital signs: Reviewed and stable  Last Vitals:  Vitals Value Taken Time  BP    Temp    Pulse 81 10/20/2017  3:09 PM  Resp 18 10/20/2017  3:09 PM  SpO2 100 % 10/20/2017  3:09 PM  Vitals shown include unvalidated device data.  Last Pain:  Vitals:   10/20/17 1306  TempSrc: Oral  PainSc:          Complications: No apparent anesthesia complications

## 2017-10-20 NOTE — ED Notes (Signed)
Attempted iv got labs

## 2017-10-20 NOTE — Op Note (Signed)
NAME: Sarah Hudson MEDICAL RECORD NO: [id number] DATE OF BIRTH: 1965-12-01 FACILITY: [facility] LOCATION: [facility] PHYSICIAN: Tennis Must, MD   OPERATIVE REPORT   DATE OF PROCEDURE: 10/20/17    PREOPERATIVE DIAGNOSIS:   Left long finger felon and paronychia   POSTOPERATIVE DIAGNOSIS:   Left long finger felon and paronychia   PROCEDURE:   1.  Left long finger incision and drainage of felon 2.  Left long finger removal of nail plate for paronychia   SURGEON:  Leanora Cover, M.D.   ASSISTANT: none   ANESTHESIA:  General   INTRAVENOUS FLUIDS:  Per anesthesia flow sheet.   ESTIMATED BLOOD LOSS:  Minimal.   COMPLICATIONS:  None.   SPECIMENS:   Cultures to micro   TOURNIQUET TIME:   Left arm: 10 minutes at 250 mmHg   DISPOSITION:  Stable to PACU.   INDICATIONS: 52 year old female states she has had a proximal 1 week of issues with the left long finger.  This been drained by urgent care.  She has had return of swelling and erythema of the pad of the left long finger with evidence of purulence under the nail.  I recommended incision and drainage in the operating room.. Risks, benefits and alternatives of surgery were discussed including the risks of blood loss, infection, damage to nerves, vessels, tendons, ligaments, bone for surgery, need for additional surgery, complications with wound healing, continued pain, stiffness.  She voiced understanding of these risks and elected to proceed.  OPERATIVE COURSE:  After being identified preoperatively by myself,  the patient and I agreed on the procedure and site of the procedure.  The surgical site was marked.  Surgical consent had been signed.  Antibiotics were held for cultures.  She was transferred to the operating room and placed on the operating table in supine position with the Left upper extremity on an arm board.  General anesthesia was induced by the anesthesiologist.  Left upper extremity was prepped and draped in  normal sterile orthopedic fashion.  A surgical pause was performed between the surgeons, anesthesia, and operating room staff and all were in agreement as to the patient, procedure, and site of procedure.  Tourniquet at the proximal aspect of the extremity was inflated to 250 mmHg after exsanguination of the arm with an Esmarch bandage.    The nail plate was elevated with a freer elevator.  There is gross purulence underneath.  Cultures taken for aerobes anaerobes fungus.  The nail plate was removed.  There is infection at the distal aspect of the nail plate coursing underneath the epidermis.  At the radial gutter there was purulence and communication of the area with the pulp of the finger.  An incision was made at the radial side of the pad of the finger and carried in subtenons tissues by spreading technique.  All septae were divided.  The wounds were copiously irrigated with sterile saline and packed with quarter inch iodoform gauze.  A piece of Xeroform was placed in the nail fold.  Wound was dressed with sterile Xeroform with the exception of the portions of the been packed to allow for drainage.  Finger was dressed with sterile 4 x 4's and wrapped lightly with a Coban dressing.  An AlumaFoam splint was placed and wrapped lightly with Coban dressing.  The tourniquet was deflated at 10 minutes.  Fingertips were pink with brisk capillary refill after deflation of tourniquet.  The operative  drapes were broken down.  The patient  was awoken from anesthesia safely.  She was transferred back to the stretcher and taken to PACU in stable condition.  I will see her back in the office in 3-4 days for postoperative followup.  I will give her a prescription for Norco 5/325 1-2 tabs PO q6 hours prn pain, dispense # 20 and Bactrim DS 1 p.o. twice daily x7 days.   Tennis Must, MD Electronically signed, 10/20/17

## 2017-10-20 NOTE — Discharge Instructions (Addendum)
[facility] Instructions °Hand Surgery ° °Wound Care: °Keep your hand elevated above the Testa of your heart.  Do not allow it to dangle by your side.  Keep the dressing dry and do not remove it unless your doctor advises you to do so.  He will usually change it at the time of your post-op visit.  Moving your fingers is advised to stimulate circulation but will depend on the site of your surgery.  If you have a splint applied, your doctor will advise you regarding movement. ° °Activity: °Do not drive or operate machinery today.  Rest today and then you may return to your normal activity and work as indicated by your physician. ° °Diet:  °Drink liquids today or eat a light diet.  You may resume a regular diet tomorrow.   ° °General expectations: °Pain for two to three days. °Fingers may become slightly swollen. ° °Call your doctor if any of the following occur: °Severe pain not relieved by pain medication. °Elevated temperature. °Dressing soaked with blood. °Inability to move fingers. °White or bluish color to fingers. ° ° °Post Anesthesia Home Care Instructions ° °Activity: °Get plenty of rest for the remainder of the day. A responsible individual must stay with you for 24 hours following the procedure.  °For the next 24 hours, DO NOT: °-Drive a car °-Operate machinery °-Drink alcoholic beverages °-Take any medication unless instructed by your physician °-Make any legal decisions or sign important papers. ° °Meals: °Start with liquid foods such as gelatin or soup. Progress to regular foods as tolerated. Avoid greasy, spicy, heavy foods. If nausea and/or vomiting occur, drink only clear liquids until the nausea and/or vomiting subsides. Call your physician if vomiting continues. ° °Special Instructions/Symptoms: °Your throat may feel dry or sore from the anesthesia or the breathing tube placed in your throat during surgery. If this causes discomfort, gargle with warm salt water. The discomfort should disappear within  24 hours. ° °If you had a scopolamine patch placed behind your ear for the management of post- operative nausea and/or vomiting: ° °1. The medication in the patch is effective for 72 hours, after which it should be removed.  Wrap patch in a tissue and discard in the trash. Wash hands thoroughly with soap and water. °2. You may remove the patch earlier than 72 hours if you experience unpleasant side effects which may include dry mouth, dizziness or visual disturbances. °3. Avoid touching the patch. Wash your hands with soap and water after contact with the patch. °  ° ° °

## 2017-10-21 ENCOUNTER — Encounter (HOSPITAL_BASED_OUTPATIENT_CLINIC_OR_DEPARTMENT_OTHER): Payer: Self-pay | Admitting: Orthopedic Surgery

## 2017-10-21 NOTE — Anesthesia Postprocedure Evaluation (Signed)
Anesthesia Post Note  Patient: Sarah Hudson  Procedure(s) Performed: INCISION AND DRAINAGE LEFT LONG FINGER (Left Finger)     Patient location during evaluation: PACU Anesthesia Type: General [place] of consciousness: awake and alert Pain management: pain Spisak controlled Vital Signs Assessment: post-procedure vital signs reviewed and stable Respiratory status: spontaneous breathing, nonlabored ventilation, respiratory function stable and patient connected to nasal cannula oxygen Cardiovascular status: blood pressure returned to baseline and stable Postop Assessment: no apparent nausea or vomiting Anesthetic complications: no    Last Vitals:  Vitals:   10/20/17 1618 10/20/17 1630  BP: (!) 163/86 (!) 159/79  Pulse: 63   Resp: 14   Temp: 36.6 C   SpO2: 99%     Last Pain:  Vitals:   10/20/17 1618  TempSrc: Oral  PainSc: 6    Pain Goal:                 [name]

## 2017-10-27 LAB — AEROBIC/ANAEROBIC CULTURE (SURGICAL/DEEP WOUND)

## 2017-10-27 LAB — AEROBIC/ANAEROBIC CULTURE W GRAM STAIN (SURGICAL/DEEP WOUND)

## 2017-11-18 LAB — FUNGAL ORGANISM REFLEX

## 2017-11-18 LAB — FUNGUS CULTURE WITH STAIN

## 2017-11-18 LAB — FUNGUS CULTURE RESULT

## 2018-11-09 ENCOUNTER — Other Ambulatory Visit: Payer: Self-pay

## 2018-11-09 DIAGNOSIS — Z20822 Contact with and (suspected) exposure to covid-19: Secondary | ICD-10-CM

## 2018-11-10 LAB — NOVEL CORONAVIRUS, NAA: SARS-CoV-2, NAA: NOT DETECTED

## 2019-03-04 ENCOUNTER — Other Ambulatory Visit: Payer: Self-pay

## 2019-03-04 DIAGNOSIS — Z20822 Contact with and (suspected) exposure to covid-19: Secondary | ICD-10-CM

## 2019-03-05 LAB — NOVEL CORONAVIRUS, NAA: SARS-CoV-2, NAA: NOT DETECTED

## 2019-04-13 ENCOUNTER — Ambulatory Visit: Payer: BC Managed Care – PPO | Attending: Internal Medicine

## 2019-04-13 DIAGNOSIS — Z20822 Contact with and (suspected) exposure to covid-19: Secondary | ICD-10-CM | POA: Insufficient documentation

## 2019-04-14 LAB — NOVEL CORONAVIRUS, NAA: SARS-CoV-2, NAA: NOT DETECTED

## 2019-04-29 ENCOUNTER — Ambulatory Visit: Payer: BC Managed Care – PPO | Attending: Internal Medicine

## 2019-04-29 DIAGNOSIS — Z20822 Contact with and (suspected) exposure to covid-19: Secondary | ICD-10-CM | POA: Insufficient documentation

## 2019-04-30 LAB — NOVEL CORONAVIRUS, NAA: SARS-CoV-2, NAA: NOT DETECTED

## 2019-06-02 ENCOUNTER — Ambulatory Visit: Payer: Self-pay | Attending: Internal Medicine

## 2019-06-02 DIAGNOSIS — Z23 Encounter for immunization: Secondary | ICD-10-CM | POA: Insufficient documentation

## 2019-06-02 NOTE — Progress Notes (Signed)
   Covid-19 Vaccination Clinic  Name:  Sarah Hudson    MRN: [id number] DOB: 15-Jan-1966  06/02/2019  [name] was observed post Covid-19 immunization for 15 minutes without incident. She was provided with Vaccine Information Sheet and instruction to access the V-Safe system.   [name] was instructed to call 911 with any severe reactions post vaccine: Marland Kitchen Difficulty breathing  . Swelling of face and throat  . A fast heartbeat  . A bad rash all over body  . Dizziness and weakness   Immunizations Administered    Name Date Dose VIS Date Route   Pfizer COVID-19 Vaccine 06/02/2019  2:10 PM 0.3 mL 03/11/2019 Intramuscular   Manufacturer: [place]   Lot: [id number]   [place]: [id number]

## 2019-06-29 ENCOUNTER — Ambulatory Visit: Payer: Self-pay | Attending: Internal Medicine

## 2019-06-29 DIAGNOSIS — Z23 Encounter for immunization: Secondary | ICD-10-CM

## 2019-06-29 NOTE — Progress Notes (Signed)
   Covid-19 Vaccination Clinic  Name:  Sarah Hudson    MRN: [id number] DOB: Mar 21, 1966  06/29/2019  Sarah Hudson was observed post Covid-19 immunization for 15 minutes without incident. She was provided with Vaccine Information Sheet and instruction to access the V-Safe system.   Sarah Hudson was instructed to call 911 with any severe reactions post vaccine: Marland Kitchen Difficulty breathing  . Swelling of face and throat  . A fast heartbeat  . A bad rash all over body  . Dizziness and weakness   Immunizations Administered    Name Date Dose VIS Date Route   Pfizer COVID-19 Vaccine 06/29/2019  1:29 PM 0.3 mL 03/11/2019 Intramuscular   Manufacturer: [employer]   Lot: [id number]   [place]: [id number]

## 2019-09-15 ENCOUNTER — Other Ambulatory Visit: Payer: Self-pay

## 2019-09-15 ENCOUNTER — Emergency Department (HOSPITAL_BASED_OUTPATIENT_CLINIC_OR_DEPARTMENT_OTHER): Payer: BC Managed Care – PPO

## 2019-09-15 ENCOUNTER — Emergency Department (HOSPITAL_COMMUNITY): Payer: BC Managed Care – PPO

## 2019-09-15 ENCOUNTER — Emergency Department (HOSPITAL_COMMUNITY)
Admission: EM | Admit: 2019-09-15 | Discharge: 2019-09-15 | Disposition: A | Discharge status: Home / Self Care | Payer: BC Managed Care – PPO | Attending: Emergency Medicine | Admitting: Emergency Medicine

## 2019-09-15 DIAGNOSIS — Z79899 Other long term (current) drug therapy: Secondary | ICD-10-CM | POA: Insufficient documentation

## 2019-09-15 DIAGNOSIS — M79605 Pain in left leg: Secondary | ICD-10-CM

## 2019-09-15 DIAGNOSIS — R609 Edema, unspecified: Secondary | ICD-10-CM | POA: Diagnosis not present

## 2019-09-15 DIAGNOSIS — M79662 Pain in left lower leg: Secondary | ICD-10-CM | POA: Diagnosis not present

## 2019-09-15 DIAGNOSIS — R079 Chest pain, unspecified: Secondary | ICD-10-CM | POA: Diagnosis not present

## 2019-09-15 LAB — COMPREHENSIVE METABOLIC PANEL
ALT: 11 U/L (ref 0–44)
AST: 24 U/L (ref 15–41)
Albumin: 3.6 g/dL (ref 3.5–5.0)
Alkaline Phosphatase: 66 U/L (ref 38–126)
Anion gap: 9 (ref 5–15)
BUN: 13 mg/dL (ref 6–20)
CO2: 21 mmol/L — ABNORMAL LOW (ref 22–32)
Calcium: 8.6 mg/dL — ABNORMAL LOW (ref 8.9–10.3)
Chloride: 108 mmol/L (ref 98–111)
Creatinine, Ser: 0.86 mg/dL (ref 0.44–1.00)
GFR calc Af Amer: >60 mL/min (ref >60)
GFR calc non Af Amer: >60 mL/min (ref >60)
Glucose, Bld: 113 mg/dL — ABNORMAL HIGH (ref 70–99)
Potassium: 4.3 mmol/L (ref 3.5–5.1)
Sodium: 138 mmol/L (ref 135–145)
Total Bilirubin: 1 mg/dL (ref 0.3–1.2)
Total Protein: 7.2 g/dL (ref 6.5–8.1)

## 2019-09-15 LAB — CBC
HCT: 42 % (ref 36.0–46.0)
Hemoglobin: 13.5 g/dL (ref 12.0–15.0)
MCH: 29.7 pg (ref 26.0–34.0)
MCHC: 32.1 g/dL (ref 30.0–36.0)
MCV: 92.5 fL (ref 80.0–100.0)
Platelets: 237 10*3/uL (ref 150–400)
RBC: 4.54 MIL/uL (ref 3.87–5.11)
RDW: 14.1 % (ref 11.5–15.5)
WBC: 6.4 10*3/uL (ref 4.0–10.5)
nRBC: 0 % (ref 0.0–0.2)

## 2019-09-15 LAB — TROPONIN I (HIGH SENSITIVITY)
Troponin I (High Sensitivity): <2 ng/L (ref <18)
Troponin I (High Sensitivity): 3 ng/L (ref <18)

## 2019-09-15 LAB — I-STAT BETA HCG BLOOD, ED (MC, WL, AP ONLY): I-stat hCG, quantitative: <5 m[IU]/mL (ref <5)

## 2019-09-15 LAB — PROTIME-INR
INR: 1.1 (ref 0.8–1.2)
Prothrombin Time: 13.7 seconds (ref 11.4–15.2)

## 2019-09-15 LAB — APTT: aPTT: 32 seconds (ref 24–36)

## 2019-09-15 MED ORDER — SODIUM CHLORIDE 0.9% FLUSH: 3.0000 mL | Freq: Once | INTRAVENOUS | Status: DC

## 2019-09-15 NOTE — Discharge Instructions (Addendum)
Tylenol, Motrin as needed for pain control.  Recommend return to ER for reassessment if you develop chest pain, difficulty breathing or other new concerning symptom.  Recommend follow-up with primary doctor regarding your symptoms today.

## 2019-09-15 NOTE — ED Triage Notes (Signed)
Pt c/o ofL calf pain and CP. Pt states it began this weekend. Hx of blood clots. Took rd trip last thursday

## 2019-09-15 NOTE — ED Notes (Signed)
Pt transported to ultrasound.

## 2019-09-15 NOTE — ED Notes (Signed)
Pt d/c home per MD order. Discharge summary reviewed with pt, pt verbalizes understanding. Pt d/c home with visitor. No s/s of acute distress noted,. Ambulatory off unit.

## 2019-09-15 NOTE — ED Provider Notes (Signed)
[facility] DEPARTMENT Provider Note   CSN: [id number] Arrival date & time: 09/15/19  0545     History Chief Complaint  Patient presents with  . Chest Pain  . calf pain    Sarah Hudson is a 54 y.o. female.  Presents to ER with concern for leg pain.  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  She states that over the last few days she has noted some mild pain in her left leg, no numbness, weakness, skin discoloration, swelling.  Concerned she may have another blood clot.  She denies any ongoing chest pain, felt anxious earlier and had slight chest discomfort episode.  Not associate with exertion.  HPI     Past Medical History:  Diagnosis Date  . Anemia   . Chicken pox   . Hx of blood clots     Patient Active Problem List   Diagnosis Date Noted  . Hypercoagulable state ([place]) 09/19/2015  . Chronic anticoagulation 07/30/2015  . Menorrhagia 07/30/2015  . Symptomatic anemia 07/30/2015  . Pulmonary embolism ([place]) 01/05/2015  . DVT (deep venous thrombosis) ([place]) 01/05/2015  . Iron (Fe) deficiency anemia 01/05/2015  . Numbness of feet 12/13/2014    Past Surgical History:  Procedure Laterality Date  . INCISION AND DRAINAGE Left 10/20/2017   Procedure: INCISION AND DRAINAGE LEFT LONG FINGER;  Surgeon: Leanora Cover, MD;  Location: [facility];  Service: Orthopedics;  Laterality: Left;  . TUBAL LIGATION    . UMBILICAL HERNIA REPAIR       OB History   No obstetric history on file.     Family History  Problem Relation Age of Onset  . Arthritis Mother   . Hypertension Mother   . Diabetes Mother   . Hypertension Father   . Heart attack Maternal Grandfather        50's    Social History   Tobacco Use  . Smoking status: Never Smoker  . Smokeless tobacco: Never Used  Substance Use Topics  . Alcohol use: Yes    Comment: Occasional  . Drug use: No    Home Medications Prior to Admission medications    Medication Sig Start Date End Date Taking? Authorizing Provider  ELDERBERRY PO Take 1 tablet by mouth 2 (two) times daily as needed (supplement).   Yes [provider]  ibuprofen (ADVIL) 200 MG tablet Take 400 mg by mouth every 6 (six) hours as needed for moderate pain.    Yes [provider]  Multiple Vitamin (MULTIVITAMIN WITH MINERALS) TABS tablet Take 2 tablets by mouth daily.   Yes [provider]  HYDROcodone-acetaminophen (NORCO) 5-325 MG tablet 1-2 tabs po q6 hours prn pain Patient not taking: Reported on 09/15/2019 10/20/17   Leanora Cover, MD  sulfamethoxazole-trimethoprim (BACTRIM DS) 800-160 MG tablet Take 1 tablet by mouth 2 (two) times daily. Patient not taking: Reported on 09/15/2019 10/20/17   Leanora Cover, MD    Allergies    Patient has no known allergies.  Review of Systems   Review of Systems  Constitutional: Negative for chills and fever.  HENT: Negative for ear pain and sore throat.   Eyes: Negative for pain and visual disturbance.  Respiratory: Negative for cough and shortness of breath.   Cardiovascular: Positive for chest pain. Negative for palpitations.  Gastrointestinal: Negative for abdominal pain and vomiting.  Genitourinary: Negative for dysuria and hematuria.  Musculoskeletal: Positive for arthralgias. Negative for back pain.  Skin: Negative  for color change and rash.  Neurological: Negative for seizures and syncope.  All other systems reviewed and are negative.   Physical Exam Updated Vital Signs BP 122/74   Pulse 64   Temp 98.7 F (37.1 C) (Oral)   Resp 19   Ht 5\' 5"  (1.651 m)   Wt 104.3 kg   SpO2 98%   BMI 38.27 kg/m   Physical Exam Vitals and nursing note reviewed.  Constitutional:      General: She is not in acute distress.    Appearance: She is well-developed.  HENT:     Head: Normocephalic and atraumatic.  Eyes:     Conjunctiva/sclera: Conjunctivae normal.  Cardiovascular:     Rate and Rhythm: Normal rate  and regular rhythm.     Heart sounds: No murmur heard.   Pulmonary:     Effort: Pulmonary effort is normal. No respiratory distress.     Breath sounds: Normal breath sounds.  Chest:     Chest wall: No tenderness or crepitus.  Abdominal:     Palpations: Abdomen is soft.     Tenderness: There is no abdominal tenderness.  Musculoskeletal:     Cervical back: Neck supple.     Right lower leg: No edema.     Left lower leg: No edema.     Comments: Mild tenderness noted in left lower leg, no deformity appreciated, no erythema, normal DP PT pulses  Skin:    General: Skin is warm and dry.     Capillary Refill: Capillary refill takes less than 2 seconds.  Neurological:     General: No focal deficit present.     Mental Status: She is alert and oriented to person, place, and time.     ED Results / Procedures / Treatments   Labs (all labs ordered are listed, but only abnormal results are displayed) Labs Reviewed  COMPREHENSIVE METABOLIC PANEL - Abnormal; Notable for the following components:      Result Value   CO2 21 (*)    Glucose, Bld 113 (*)    Calcium 8.6 (*)    All other components within normal limits  CBC  PROTIME-INR  APTT  I-STAT BETA HCG BLOOD, ED ([facility], [facility], [facility] ONLY)  TROPONIN I (HIGH SENSITIVITY)  TROPONIN I (HIGH SENSITIVITY)    EKG EKG Interpretation  Date/Time:  Thursday September 15 2019 05:49:56 EDT Ventricular Rate:  85 PR Interval:  144 QRS Duration: 76 QT Interval:  374 QTC Calculation: 445 R Axis:   36 Text Interpretation: Normal sinus rhythm Cannot rule out Anterior infarct , age undetermined Abnormal ECG Confirmed by Madalyn Rob [phone]) on 09/15/2019 8:51:31 AM   Radiology DG Chest 2 View  Result Date: 09/15/2019 CLINICAL DATA:  Chest pain EXAM: CHEST - 2 VIEW COMPARISON:  January 05, 2015 FINDINGS: The heart size and mediastinal contours are within normal limits. Both lungs are clear. The visualized skeletal structures are unremarkable. IMPRESSION: No  active cardiopulmonary disease. Electronically Signed   By: Prudencio Pair M.D.   On: 09/15/2019 06:38   VAS [place] LOWER EXTREMITY VENOUS (DVT) ([facility] and [facility] 7a-7p)  Result Date: 09/15/2019  Lower Venous DVTStudy Indications: Edema.  Comparison Study: 08/01/15 previous Performing Technologist: Abram Sander RVS  Examination Guidelines: A complete evaluation includes B-mode imaging, spectral Doppler, color Doppler, and power Doppler as needed of all accessible portions of each vessel. Bilateral testing is considered an integral part of a complete examination. Limited examinations for reoccurring indications may be performed as noted. The  reflux portion of the exam is performed with the patient in reverse Trendelenburg.  +-----+---------------+---------+-----------+----------+--------------+ RIGHTCompressibilityPhasicitySpontaneityPropertiesThrombus Aging +-----+---------------+---------+-----------+----------+--------------+ CFV  Full           Yes      Yes                                 +-----+---------------+---------+-----------+----------+--------------+   +---------+---------------+---------+-----------+----------+--------------+ LEFT     CompressibilityPhasicitySpontaneityPropertiesThrombus Aging +---------+---------------+---------+-----------+----------+--------------+ CFV      Full           Yes      Yes                                 +---------+---------------+---------+-----------+----------+--------------+ SFJ      Full                                                        +---------+---------------+---------+-----------+----------+--------------+ FV Prox  Full                                                        +---------+---------------+---------+-----------+----------+--------------+ FV Mid   Full                                                        +---------+---------------+---------+-----------+----------+--------------+ FV DistalFull                                                         +---------+---------------+---------+-----------+----------+--------------+ PFV      Full                                                        +---------+---------------+---------+-----------+----------+--------------+ POP      Full           Yes      Yes                                 +---------+---------------+---------+-----------+----------+--------------+ PTV      Full                                                        +---------+---------------+---------+-----------+----------+--------------+ PERO  Not visualized +---------+---------------+---------+-----------+----------+--------------+     Summary: RIGHT: - No evidence of common femoral vein obstruction.  LEFT: - There is no evidence of deep vein thrombosis in the lower extremity.  - No cystic structure found in the popliteal fossa.  *See table(s) above for measurements and observations. Electronically signed by Monica Martinez MD on 09/15/2019 at 3:19:01 PM.    Final     Procedures Procedures (including critical care time)  Medications Ordered in ED Medications - No data to display  ED Course  I have reviewed the triage vital signs and the nursing notes.  Pertinent labs & imaging results that were available during my care of the patient were reviewed by me and considered in my medical decision making (see chart for details).    MDM Rules/Calculators/A&P                          54 year old lady with past medical history DVT/PE presents to ER with concern for new onset left leg pain.  Exam, patient well-appearing with stable vital signs, noted very mild tenderness in her calf but no real deformity was appreciated.  Given her history, check DVT study which was negative.  She had mentioned having very slight episode of chest discomfort earlier in the day.  No ongoing symptoms, given description, low suspicion for  ACS or other acute process.  CXR negative, EKG unremarkable, troponin within normal months.  Recommend follow-up with primary doctor, discharged home.    After the discussed management above, the patient was determined to be safe for discharge.  The patient was in agreement with this plan and all questions regarding their care were answered.  ED return precautions were discussed and the patient will return to the ED with any significant worsening of condition.  Final Clinical Impression(s) / ED Diagnoses Final diagnoses:  Pain of left lower extremity    Rx / DC Orders ED Discharge Orders    None       Lucrezia Starch, MD 09/15/19 1625

## 2019-09-15 NOTE — Progress Notes (Signed)
Lower extremity venous has been completed.   Preliminary results in CV Proc.   Sarah Hudson 09/15/2019 9:37 AM

## 2019-11-08 DIAGNOSIS — Z124 Encounter for screening for malignant neoplasm of cervix: Secondary | ICD-10-CM | POA: Diagnosis not present

## 2019-11-08 DIAGNOSIS — R102 Pelvic and perineal pain: Secondary | ICD-10-CM | POA: Diagnosis not present

## 2019-11-08 DIAGNOSIS — Z01411 Encounter for gynecological examination (general) (routine) with abnormal findings: Secondary | ICD-10-CM | POA: Diagnosis not present

## 2019-11-08 DIAGNOSIS — N912 Amenorrhea, unspecified: Secondary | ICD-10-CM | POA: Diagnosis not present

## 2019-11-08 DIAGNOSIS — Z6841 Body Mass Index (BMI) 40.0 and over, adult: Secondary | ICD-10-CM | POA: Diagnosis not present

## 2019-11-08 LAB — RESULTS CONSOLE HPV: CHL HPV: NEGATIVE

## 2019-11-08 LAB — HM PAP SMEAR

## 2019-11-22 DIAGNOSIS — N912 Amenorrhea, unspecified: Secondary | ICD-10-CM | POA: Diagnosis not present

## 2019-11-22 DIAGNOSIS — Z1231 Encounter for screening mammogram for malignant neoplasm of breast: Secondary | ICD-10-CM | POA: Diagnosis not present

## 2019-11-22 DIAGNOSIS — R102 Pelvic and perineal pain: Secondary | ICD-10-CM | POA: Diagnosis not present

## 2019-11-22 DIAGNOSIS — D250 Submucous leiomyoma of uterus: Secondary | ICD-10-CM | POA: Diagnosis not present

## 2019-11-23 LAB — HM MAMMOGRAPHY

## 2020-02-20 ENCOUNTER — Ambulatory Visit: Payer: BC Managed Care – PPO | Attending: Internal Medicine

## 2020-02-20 DIAGNOSIS — Z23 Encounter for immunization: Secondary | ICD-10-CM

## 2020-02-20 NOTE — Progress Notes (Signed)
   Covid-19 Vaccination Clinic  Name:  Kimanh Templeman Dubinsky    MRN: [id number] DOB: Feb 27, 1966  02/20/2020  Ms. Bezanson was observed post Covid-19 immunization for 15 minutes without incident. She was provided with Vaccine Information Sheet and instruction to access the V-Safe system.   Ms. Mosely was instructed to call 911 with any severe reactions post vaccine: Marland Kitchen Difficulty breathing  . Swelling of face and throat  . A fast heartbeat  . A bad rash all over body  . Dizziness and weakness   Immunizations Administered    Name Date Dose VIS Date Route   Pfizer COVID-19 Vaccine 02/20/2020  3:17 PM 0.3 mL 01/18/2020 Intramuscular   Manufacturer: [place]   Lot: [id number]   [place]: [id number]

## 2020-04-05 ENCOUNTER — Other Ambulatory Visit: Payer: BC Managed Care – PPO

## 2020-04-05 DIAGNOSIS — Z20822 Contact with and (suspected) exposure to covid-19: Secondary | ICD-10-CM

## 2020-04-08 LAB — NOVEL CORONAVIRUS, NAA: SARS-CoV-2, NAA: DETECTED — AB

## 2020-04-11 ENCOUNTER — Other Ambulatory Visit: Payer: BC Managed Care – PPO

## 2020-04-11 DIAGNOSIS — Z20822 Contact with and (suspected) exposure to covid-19: Secondary | ICD-10-CM | POA: Diagnosis not present

## 2020-04-13 LAB — NOVEL CORONAVIRUS, NAA: SARS-CoV-2, NAA: NOT DETECTED

## 2020-04-13 LAB — SARS-COV-2, NAA 2 DAY TAT

## 2020-07-05 ENCOUNTER — Encounter: Payer: Self-pay | Admitting: Nurse Practitioner

## 2020-07-05 ENCOUNTER — Ambulatory Visit (INDEPENDENT_AMBULATORY_CARE_PROVIDER_SITE_OTHER): Payer: BC Managed Care – PPO | Admitting: Nurse Practitioner

## 2020-07-05 ENCOUNTER — Other Ambulatory Visit: Payer: Self-pay

## 2020-07-05 VITALS — BP 132/70 | HR 76 | Temp 98.0°F | Wt 229.4 lb

## 2020-07-05 DIAGNOSIS — R1906 Epigastric swelling, mass or lump: Secondary | ICD-10-CM | POA: Diagnosis not present

## 2020-07-05 DIAGNOSIS — Z1211 Encounter for screening for malignant neoplasm of colon: Secondary | ICD-10-CM | POA: Diagnosis not present

## 2020-07-05 DIAGNOSIS — Z136 Encounter for screening for cardiovascular disorders: Secondary | ICD-10-CM | POA: Diagnosis not present

## 2020-07-05 DIAGNOSIS — Z86718 Personal history of other venous thrombosis and embolism: Secondary | ICD-10-CM | POA: Diagnosis not present

## 2020-07-05 DIAGNOSIS — Z86711 Personal history of pulmonary embolism: Secondary | ICD-10-CM

## 2020-07-05 DIAGNOSIS — Z13220 Encounter for screening for lipoid disorders: Secondary | ICD-10-CM

## 2020-07-05 DIAGNOSIS — Z0001 Encounter for general adult medical examination with abnormal findings: Secondary | ICD-10-CM

## 2020-07-05 DIAGNOSIS — Z6838 Body mass index (BMI) 38.0-38.9, adult: Secondary | ICD-10-CM

## 2020-07-05 MED ORDER — ASPIRIN EC 81 MG PO TBEC: 81.0000 mg | DELAYED_RELEASE_TABLET | Freq: Every day | ORAL | 11 refills | Status: DC

## 2020-07-05 NOTE — Assessment & Plan Note (Addendum)
Repeat venous doppler was negative for L.LE DVT xarelto d/c after 24month due to severe menorrhagia. Evaluated by hematology 2017: ASA 81mg  recommended Trigger by prolonged immobilization during road trip

## 2020-07-05 NOTE — Assessment & Plan Note (Addendum)
2016 CTA chest: right lung PE No CP or SOB or palpitation today xarelto d/c after 52month due to severe menorrhagia. Evaluated by hematology 2017: ASA 81mg  recommended

## 2020-07-05 NOTE — Patient Instructions (Addendum)
Schedule fasting blood draw. You will need to be fasting 6-8hrs prior to blood draw. Ok to drink water.  You will be contacted to schedule an appt with GI and for ABD [place]  Sign medical release to get records from GYN.  Preventive Care 41-53 Years Old, Female Preventive care refers to lifestyle choices and visits with your health care provider that can promote health and wellness. This includes:  A yearly physical exam. This is also called an annual wellness visit.  Regular dental and eye exams.  Immunizations.  Screening for certain conditions.  Healthy lifestyle choices, such as: ? Eating a healthy diet. ? Getting regular exercise. ? Not using drugs or products that contain nicotine and tobacco. ? Limiting alcohol use. What can I expect for my preventive care visit? Physical exam Your health care provider will check your:  Height and weight. These may be used to calculate your BMI (body mass index). BMI is a measurement that tells if you are at a healthy weight.  Heart rate and blood pressure.  Body temperature.  Skin for abnormal spots. Counseling Your health care provider may ask you questions about your:  Past medical problems.  Family's medical history.  Alcohol, tobacco, and drug use.  Emotional well-being.  Home life and relationship well-being.  Sexual activity.  Diet, exercise, and sleep habits.  Work and work Statistician.  Access to firearms.  Method of birth control.  Menstrual cycle.  Pregnancy history. What immunizations do I need? Vaccines are usually given at various ages, according to a schedule. Your health care provider will recommend vaccines for you based on your age, medical history, and lifestyle or other factors, such as travel or where you work.   What tests do I need? Blood tests  Lipid and cholesterol levels. These may be checked every 5 years, or more often if you are over 33 years old.  Hepatitis C test.  Hepatitis B  test. Screening  Lung cancer screening. You may have this screening every year starting at age 64 if you have a 30-pack-year history of smoking and currently smoke or have quit within the past 15 years.  Colorectal cancer screening. ? All adults should have this screening starting at age 86 and continuing until age 20. ? Your health care provider may recommend screening at age 67 if you are at increased risk. ? You will have tests every 1-10 years, depending on your results and the type of screening test.  Diabetes screening. ? This is done by checking your blood sugar (glucose) after you have not eaten for a while (fasting). ? You may have this done every 1-3 years.  Mammogram. ? This may be done every 1-2 years. ? Talk with your health care provider about when you should start having regular mammograms. This may depend on whether you have a family history of breast cancer.  BRCA-related cancer screening. This may be done if you have a family history of breast, ovarian, tubal, or peritoneal cancers.  Pelvic exam and Pap test. ? This may be done every 3 years starting at age 80. ? Starting at age 46, this may be done every 5 years if you have a Pap test in combination with an HPV test. Other tests  STD (sexually transmitted disease) testing, if you are at risk.  Bone density scan. This is done to screen for osteoporosis. You may have this scan if you are at high risk for osteoporosis. Talk with your health care provider about your  test results, treatment options, and if necessary, the need for more tests. Follow these instructions at home: Eating and drinking  Eat a diet that includes fresh fruits and vegetables, whole grains, lean protein, and low-fat dairy products.  Take vitamin and mineral supplements as recommended by your health care provider.  Do not drink alcohol if: ? Your health care provider tells you not to drink. ? You are pregnant, may be pregnant, or are planning  to become pregnant.  If you drink alcohol: ? Limit how much you have to 0-1 drink a day. ? Be aware of how much alcohol is in your drink. In the [place] one drink equals one 12 oz bottle of beer (355 mL), one 5 oz glass of wine (148 mL), or one 1 oz glass of hard liquor (44 mL).   Lifestyle  Take daily care of your teeth and gums. Brush your teeth every morning and night with fluoride toothpaste. Floss one time each day.  Stay active. Exercise for at least 30 minutes 5 or more days each week.  Do not use any products that contain nicotine or tobacco, such as cigarettes, e-cigarettes, and chewing tobacco. If you need help quitting, ask your health care provider.  Do not use drugs.  If you are sexually active, practice safe sex. Use a condom or other form of protection to prevent STIs (sexually transmitted infections).  If you do not wish to become pregnant, use a form of birth control. If you plan to become pregnant, see your health care provider for a prepregnancy visit.  If told by your health care provider, take low-dose aspirin daily starting at age 53.  Find healthy ways to cope with stress, such as: ? Meditation, yoga, or listening to music. ? Journaling. ? Talking to a trusted person. ? Spending time with friends and family. Safety  Always wear your seat belt while driving or riding in a vehicle.  Do not drive: ? If you have been drinking alcohol. Do not ride with someone who has been drinking. ? When you are tired or distracted. ? While texting.  Wear a helmet and other protective equipment during sports activities.  If you have firearms in your house, make sure you follow all gun safety procedures. What's next?  Visit your health care provider once a year for an annual wellness visit.  Ask your health care provider how often you should have your eyes and teeth checked.  Stay up to date on all vaccines. This information is not intended to replace advice given to you  by your health care provider. Make sure you discuss any questions you have with your health care provider. Document Revised: 12/20/2019 Document Reviewed: 11/26/2017 Elsevier Patient Education  2021 [employer].

## 2020-07-05 NOTE — Progress Notes (Signed)
Subjective:    Patient ID: Sarah Hudson, female    DOB: [birth year], 55 y.o.   MRN: [id number]  Patient presents today for CPE and eval of chronic conditions  HPI  Mid abdominal mass: First noted over 1year ago, increase in size and worsening tenderness, denies any GI symptoms.  History of pulmonary embolism 2016 CTA chest: right lung PE No CP or SOB or palpitation today xarelto d/c after 56month due to severe menorrhagia. Evaluated by hematology 2017: ASA 81mg  recommended  History of DVT of lower extremity Repeat venous doppler was negative for L.LE DVT xarelto d/c after 61month due to severe menorrhagia. Evaluated by hematology 2017: ASA 81mg  recommended Trigger by prolonged immobilization during road trip  Sexual History (orientation,birth control, marital status, STD):married, perimenopausal, pelvic and breast exam completed by GYN  Depression/Suicide: Depression screen [facility] 2/9 07/05/2020  Decreased Interest 0  Down, Depressed, Hopeless 0  PHQ - 2 Score 0  Altered sleeping 0  Tired, decreased energy 1  Change in appetite 0  Feeling bad or failure about yourself  0  Trouble concentrating 0  Moving slowly or fidgety/restless 0  Suicidal thoughts 0  PHQ-9 Score 1  Difficult doing work/chores Somewhat difficult   Vision:up to date  Dental:up to date  Immunizations: (TDAP, Hep C screen, Pneumovax, Influenza, zoster)  Health Maintenance  Topic Date Due  .  Hepatitis C: One time screening is recommended by Center for Disease Control  (CDC) for  adults born from [birth year] through 1963.   Never done  . Tetanus Vaccine  Never done  . Pap Smear  Never done  . Colon Cancer Screening  Never done  . Mammogram  Never done  . Flu Shot  10/29/2020  . COVID-19 Vaccine  Completed  . HIV Screening  Completed  . HPV Vaccine  Aged Out   Diet:regular. Exercise: 74mins on [place] 3x/week Weight:  Wt Readings from Last 3 Encounters:  07/05/20 229 lb 6.4 oz (104.1 kg)  09/15/19 230 lb  (104.3 kg)  10/20/17 215 lb 2 oz (97.6 kg)   Fall Risk: Fall Risk  07/05/2020  Falls in the past year? 0  Number falls in past yr: 0  Injury with Fall? 0  Risk for fall due to : No Fall Risks  Follow up Falls evaluation completed    Medications and allergies reviewed with patient and updated if appropriate.  Patient Active Problem List   Diagnosis Date Noted  . Hypercoagulable state (Minor) 09/19/2015  . History of pulmonary embolism 01/05/2015  . History of DVT of lower extremity 01/05/2015  . Iron (Fe) deficiency anemia 01/05/2015  . Numbness of feet 12/13/2014    Current Outpatient Medications on File Prior to Visit  Medication Sig Dispense Refill  . ELDERBERRY PO Take 1 tablet by mouth 2 (two) times daily as needed (supplement).    Marland Kitchen ibuprofen (ADVIL) 200 MG tablet Take 400 mg by mouth every 6 (six) hours as needed for moderate pain.     . Multiple Vitamin (MULTIVITAMIN WITH MINERALS) TABS tablet Take 2 tablets by mouth daily.     No current facility-administered medications on file prior to visit.    Past Medical History:  Diagnosis Date  . Anemia   . Chicken pox   . Hx of blood clots     Past Surgical History:  Procedure Laterality Date  . INCISION AND DRAINAGE Left 10/20/2017   Procedure: INCISION AND DRAINAGE LEFT LONG FINGER;  Surgeon: Leanora Cover, MD;  Location:  [facility];  Service: Orthopedics;  Laterality: Left;  . TUBAL LIGATION    . UMBILICAL HERNIA REPAIR      Social History   Socioeconomic History  . Marital status: Married    Spouse name: Not on file  . Number of children: 7  . Years of education: 63  . Highest education [place]: Not on file  Occupational History  . Occupation: Optometrist  Tobacco Use  . Smoking status: Never Smoker  . Smokeless tobacco: Never Used  Vaping Use  . Vaping Use: Never used  Substance and Sexual Activity  . Alcohol use: Yes    Comment: Occasional  . Drug use: No  . Sexual activity: Not on file   Other Topics Concern  . Not on file  Social History Narrative   Fun: Travel   Denies religious beliefs effecting health care   Feels safe at home and denies abuse   Social Determinants of Health   Financial Resource Strain: Not on file  Food Insecurity: Not on file  Transportation Needs: Not on file  Physical Activity: Not on file  Stress: Not on file  Social Connections: Not on file    Family History  Problem Relation Age of Onset  . Arthritis Mother   . Hypertension Mother   . Diabetes Mother   . Hypertension Father   . Heart attack Maternal Grandfather        50's       Review of Systems  Constitutional: Negative for fever, malaise/fatigue and weight loss.  HENT: Negative for congestion and sore throat.   Eyes:       Negative for visual changes  Respiratory: Negative for cough and shortness of breath.   Cardiovascular: Negative for chest pain, palpitations and leg swelling.  Gastrointestinal: Positive for abdominal pain. Negative for blood in stool, constipation, diarrhea, heartburn and melena.  Genitourinary: Negative for dysuria, frequency and urgency.  Musculoskeletal: Negative for falls, joint pain and myalgias.  Skin: Negative for rash.  Neurological: Negative for dizziness, sensory change and headaches.  Endo/Heme/Allergies: Does not bruise/bleed easily.  Psychiatric/Behavioral: Negative for depression, substance abuse and suicidal ideas. The patient is not nervous/anxious.    Objective:   Vitals:   07/05/20 1029  BP: 132/70  Pulse: 76  Temp: 98 F (36.7 C)  SpO2: 99%   Body mass index is 38.17 kg/m.  Physical Examination:  Physical Exam Vitals reviewed.  Constitutional:      General: She is not in acute distress.    Appearance: She is obese.  HENT:     Right Ear: Tympanic membrane, ear canal and external ear normal.     Left Ear: Tympanic membrane, ear canal and external ear normal.  Eyes:     General: No scleral icterus.    Extraocular  Movements: Extraocular movements intact.     Conjunctiva/sclera: Conjunctivae normal.  Neck:     Thyroid: No thyromegaly.  Cardiovascular:     Rate and Rhythm: Normal rate and regular rhythm.     Pulses: Normal pulses.     Heart sounds: Normal heart sounds.  Pulmonary:     Effort: Pulmonary effort is normal.     Breath sounds: Normal breath sounds.  Abdominal:     General: Bowel sounds are normal. There is no distension.     Palpations: Abdomen is soft. There is mass.     Tenderness: There is no abdominal tenderness. There is guarding. There is no right CVA tenderness or left CVA tenderness.  Genitourinary:    Vagina: Normal.     Comments: Deferred breast and pelvic exam to GYN Musculoskeletal:        General: No tenderness. Normal range of motion.     Cervical back: Normal range of motion and neck supple.  Lymphadenopathy:     Cervical: No cervical adenopathy.  Skin:    General: Skin is warm and dry.  Neurological:     Mental Status: She is alert and oriented to person, place, and time.  Psychiatric:        Mood and Affect: Mood normal.        Behavior: Behavior normal.        Thought Content: Thought content normal.        Judgment: Judgment normal.    ASSESSMENT and PLAN: This visit occurred during the SARS-CoV-2 public health emergency.  Safety protocols were in place, including screening questions prior to the visit, additional usage of staff PPE, and extensive cleaning of exam room while observing appropriate contact time as indicated for disinfecting solutions.   Sarah Hudson was seen today for establish care.  Diagnoses and all orders for this visit:  Encounter for preventative adult health care exam with abnormal findings -     Ambulatory referral to Gastroenterology -     Comprehensive metabolic panel; Future -     Lipid panel; Future -     TSH -     CBC with Differential/Platelet  Encounter for lipid screening for cardiovascular disease -     Lipid panel;  Future  Class 2 severe obesity due to excess calories with serious comorbidity and body mass index (BMI) of 38.0 to 38.9 in adult [facility])  Colon cancer screening -     Ambulatory referral to Gastroenterology  Epigastric mass -     US Abdomen Limited; Future -     CT ABDOMEN LIMITED WO CONTRAST; Future  History of pulmonary embolism -     aspirin EC 81 MG tablet; Take 1 tablet (81 mg total) by mouth daily. Swallow whole.  History of DVT of lower extremity -     aspirin EC 81 MG tablet; Take 1 tablet (81 mg total) by mouth daily. Swallow whole.  unable to indentify discrete mass per ABD [place]. Ordered CT ABD to r/o ventral hernia    Problem List Items Addressed This Visit      Other   History of DVT of lower extremity    Repeat venous doppler was negative for L.LE DVT xarelto d/c after 4month due to severe menorrhagia. Evaluated by hematology 2017: ASA 81mg  recommended Trigger by prolonged immobilization during road trip      Relevant Medications   aspirin EC 81 MG tablet   History of pulmonary embolism    2016 CTA chest: right lung PE No CP or SOB or palpitation today xarelto d/c after 34month due to severe menorrhagia. Evaluated by hematology 2017: ASA 81mg  recommended      Relevant Medications   aspirin EC 81 MG tablet    Other Visit Diagnoses    Encounter for preventative adult health care exam with abnormal findings    -  Primary   Relevant Orders   Ambulatory referral to Gastroenterology   Comprehensive metabolic panel (Completed)   Lipid panel (Completed)   TSH   CBC with Differential/Platelet   Encounter for lipid screening for cardiovascular disease       Relevant Orders   Lipid panel (Completed)   Class 2 severe obesity due to  excess calories with serious comorbidity and body mass index (BMI) of 38.0 to 38.9 in adult [facility])       Colon cancer screening       Relevant Orders   Ambulatory referral to Gastroenterology   Epigastric mass       Relevant Orders    US Abdomen Limited (Completed)   CT ABDOMEN LIMITED WO CONTRAST      Follow up: Return in about 1 year (around 07/05/2021) for CPE (fasting).  Wilfred Lacy, NP

## 2020-07-06 ENCOUNTER — Ambulatory Visit
Admission: RE | Admit: 2020-07-06 | Discharge: 2020-07-06 | Disposition: A | Discharge status: Home / Self Care | Payer: BC Managed Care – PPO | Source: Ambulatory Visit | Attending: Nurse Practitioner | Admitting: Nurse Practitioner

## 2020-07-06 ENCOUNTER — Other Ambulatory Visit (INDEPENDENT_AMBULATORY_CARE_PROVIDER_SITE_OTHER): Payer: BC Managed Care – PPO

## 2020-07-06 ENCOUNTER — Encounter: Payer: Self-pay | Admitting: Nurse Practitioner

## 2020-07-06 DIAGNOSIS — R1906 Epigastric swelling, mass or lump: Secondary | ICD-10-CM

## 2020-07-06 DIAGNOSIS — Z0001 Encounter for general adult medical examination with abnormal findings: Secondary | ICD-10-CM

## 2020-07-06 DIAGNOSIS — R1900 Intra-abdominal and pelvic swelling, mass and lump, unspecified site: Secondary | ICD-10-CM | POA: Diagnosis not present

## 2020-07-06 LAB — COMPREHENSIVE METABOLIC PANEL
ALT: 11 U/L (ref 0–35)
AST: 14 U/L (ref 0–37)
Albumin: 4.2 g/dL (ref 3.5–5.2)
Alkaline Phosphatase: 54 U/L (ref 39–117)
BUN: 10 mg/dL (ref 6–23)
CO2: 28 mEq/L (ref 19–32)
Calcium: 9.2 mg/dL (ref 8.4–10.5)
Chloride: 106 mEq/L (ref 96–112)
Creatinine, Ser: 0.78 mg/dL (ref 0.40–1.20)
GFR: 87.26 mL/min (ref >60.00)
Glucose, Bld: 83 mg/dL (ref 70–99)
Potassium: 4.4 mEq/L (ref 3.5–5.1)
Sodium: 139 mEq/L (ref 135–145)
Total Bilirubin: 0.5 mg/dL (ref 0.2–1.2)
Total Protein: 7.4 g/dL (ref 6.0–8.3)

## 2020-07-06 LAB — LIPID PANEL
Cholesterol: 123 mg/dL (ref 0–200)
HDL: 50.9 mg/dL (ref >39.00)
LDL Cholesterol: 54 mg/dL (ref 0–99)
NonHDL: 71.88
Total CHOL/HDL Ratio: 2
Triglycerides: 87 mg/dL (ref 0.0–149.0)
VLDL: 17.4 mg/dL (ref 0.0–40.0)

## 2020-07-07 ENCOUNTER — Encounter: Payer: Self-pay | Admitting: Nurse Practitioner

## 2020-07-10 ENCOUNTER — Other Ambulatory Visit: Payer: Self-pay | Admitting: Family Medicine

## 2020-07-10 DIAGNOSIS — R1906 Epigastric swelling, mass or lump: Secondary | ICD-10-CM

## 2020-07-13 ENCOUNTER — Ambulatory Visit
Admission: RE | Admit: 2020-07-13 | Discharge: 2020-07-13 | Disposition: A | Discharge status: Home / Self Care | Payer: BC Managed Care – PPO | Source: Ambulatory Visit | Attending: Family Medicine | Admitting: Family Medicine

## 2020-07-13 ENCOUNTER — Other Ambulatory Visit: Payer: Self-pay

## 2020-07-13 DIAGNOSIS — R1906 Epigastric swelling, mass or lump: Secondary | ICD-10-CM

## 2020-07-13 DIAGNOSIS — R1013 Epigastric pain: Secondary | ICD-10-CM | POA: Diagnosis not present

## 2020-07-13 DIAGNOSIS — D259 Leiomyoma of uterus, unspecified: Secondary | ICD-10-CM | POA: Diagnosis not present

## 2020-07-13 DIAGNOSIS — R109 Unspecified abdominal pain: Secondary | ICD-10-CM | POA: Diagnosis not present

## 2020-07-13 DIAGNOSIS — N200 Calculus of kidney: Secondary | ICD-10-CM | POA: Diagnosis not present

## 2020-07-13 IMAGING — CR DG CHEST 2V
2 series · 2 of 2 positions shown · non-contrast
Comparison: January 05, 2015

CLINICAL DATA: Chest pain

EXAM:
CHEST - 2 VIEW

[chest pa]
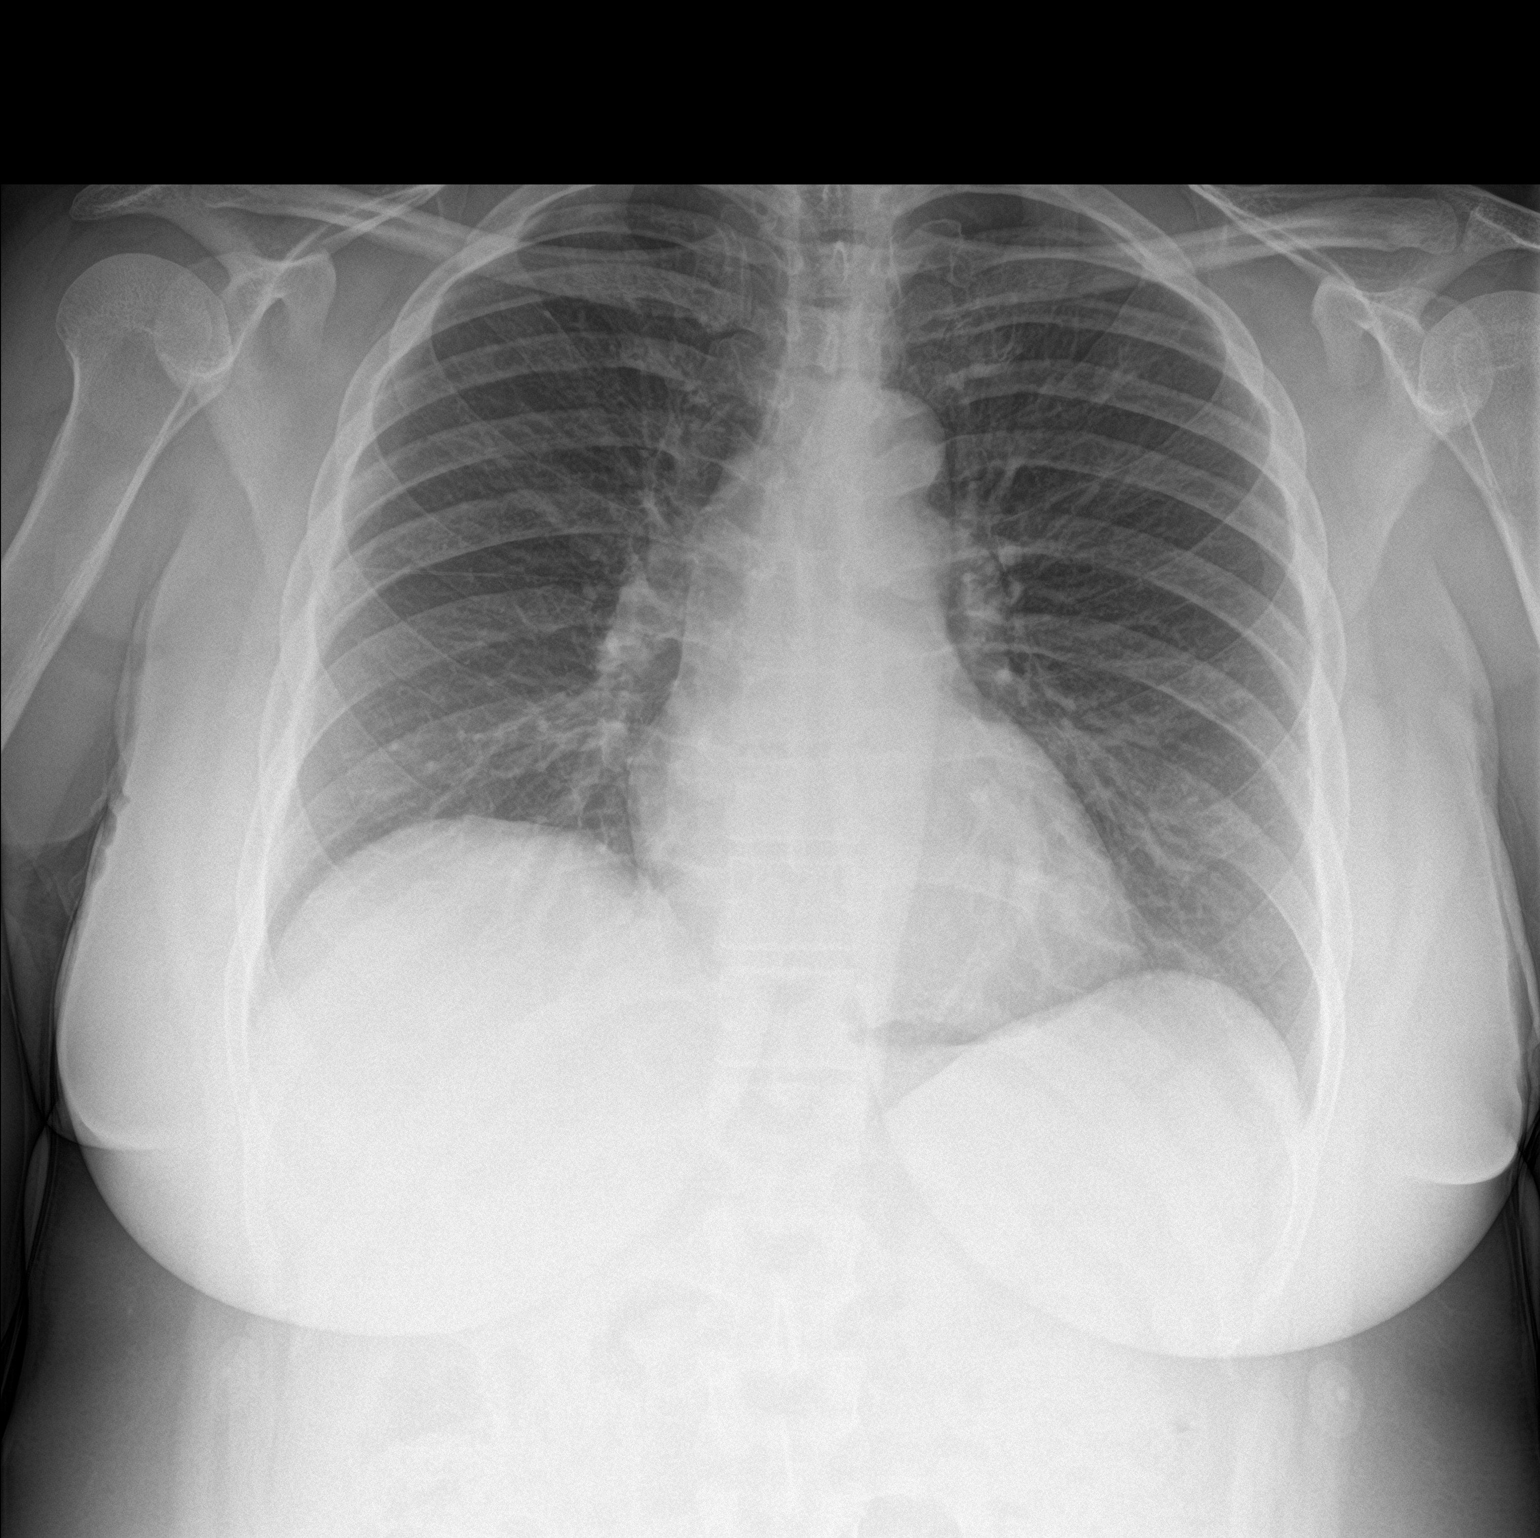

[chest lat]
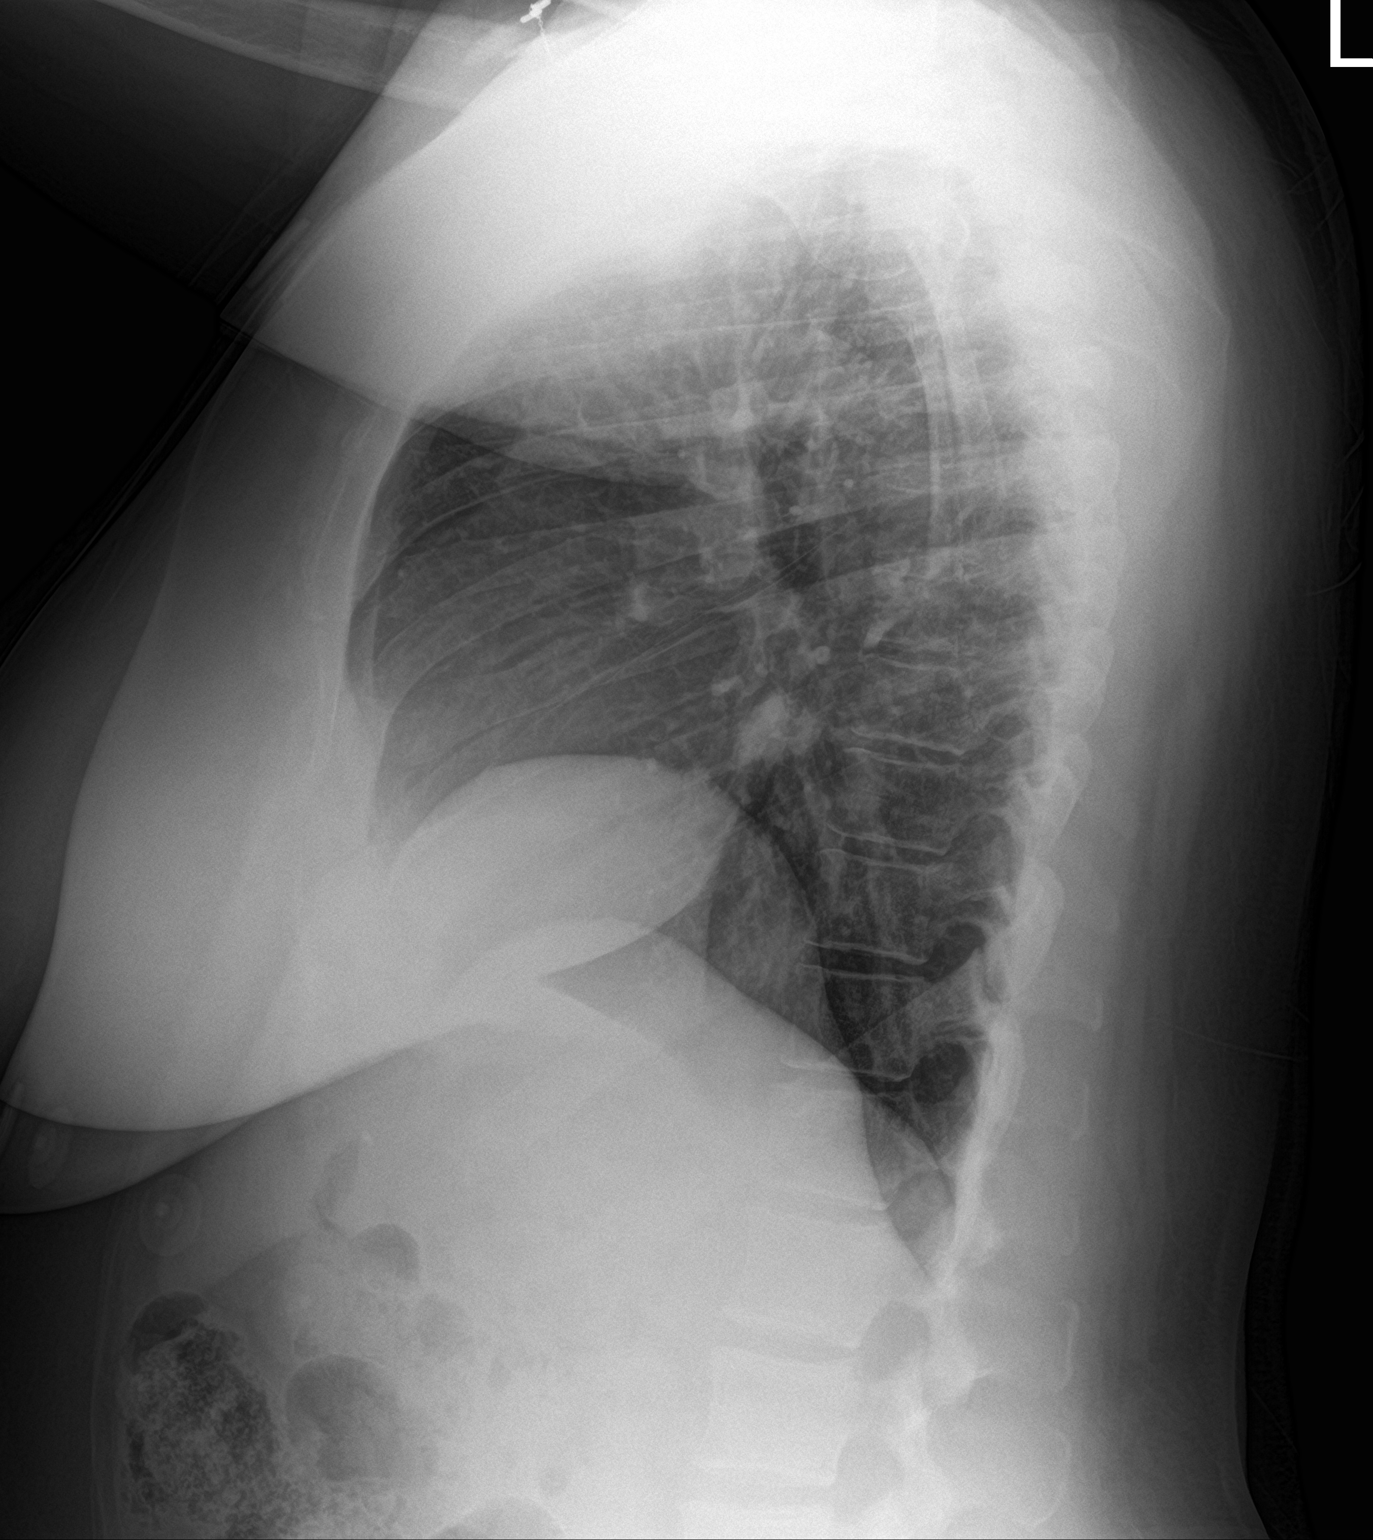

[2 of 2 positions shown; findings below may reference images not displayed]

FINDINGS: The heart size and mediastinal contours are within normal limits.
Both lungs are clear. The visualized skeletal structures are
unremarkable.
IMPRESSION: No active cardiopulmonary disease.

## 2020-07-30 ENCOUNTER — Encounter: Payer: Self-pay | Admitting: Gastroenterology

## 2020-08-01 ENCOUNTER — Ambulatory Visit (AMBULATORY_SURGERY_CENTER): Payer: BC Managed Care – PPO | Admitting: *Deleted

## 2020-08-01 ENCOUNTER — Other Ambulatory Visit: Payer: Self-pay

## 2020-08-01 VITALS — Ht 65.0 in | Wt 225.0 lb

## 2020-08-01 DIAGNOSIS — Z1211 Encounter for screening for malignant neoplasm of colon: Secondary | ICD-10-CM

## 2020-08-01 MED ORDER — SUPREP BOWEL PREP KIT 17.5-3.13-1.6 GM/177ML PO SOLN: 1.0000 | Freq: Once | ORAL | 0 refills | Status: AC

## 2020-08-01 NOTE — Progress Notes (Signed)

## 2020-08-15 ENCOUNTER — Other Ambulatory Visit: Payer: Self-pay

## 2020-08-15 ENCOUNTER — Encounter: Payer: Self-pay | Admitting: Gastroenterology

## 2020-08-15 ENCOUNTER — Ambulatory Visit (AMBULATORY_SURGERY_CENTER): Payer: BC Managed Care – PPO | Admitting: Gastroenterology

## 2020-08-15 VITALS — BP 106/48 | HR 70 | Temp 97.5°F | Resp 15 | Ht 65.0 in | Wt 225.0 lb

## 2020-08-15 DIAGNOSIS — Z1211 Encounter for screening for malignant neoplasm of colon: Secondary | ICD-10-CM

## 2020-08-15 DIAGNOSIS — D125 Benign neoplasm of sigmoid colon: Secondary | ICD-10-CM

## 2020-08-15 DIAGNOSIS — D123 Benign neoplasm of transverse colon: Secondary | ICD-10-CM

## 2020-08-15 MED ORDER — SODIUM CHLORIDE 0.9 % IV SOLN: 500.0000 mL | Freq: Once | INTRAVENOUS | Status: DC

## 2020-08-15 NOTE — Progress Notes (Signed)
pt tolerated well. VSS. awake and to recovery. Report given to RN.  

## 2020-08-15 NOTE — Op Note (Signed)
[place] Patient Name: Sarah Hudson Procedure Date: 08/15/2020 10:34 AM MRN: [id number] Endoscopist: Mauri Pole , MD Age: 55 Referring MD:  Date of Birth: 08/14/1965 Gender: Female Account #: [id number] Procedure:                Colonoscopy Indications:              Screening for colorectal malignant neoplasm Medicines:                Monitored Anesthesia Care Procedure:                Pre-Anesthesia Assessment:                           - Prior to the procedure, a History and Physical                            was performed, and patient medications and                            allergies were reviewed. The patient's tolerance of                            previous anesthesia was also reviewed. The risks                            and benefits of the procedure and the sedation                            options and risks were discussed with the patient.                            All questions were answered, and informed consent                            was obtained. Prior Anticoagulants: The patient has                            taken no previous anticoagulant or antiplatelet                            agents. ASA Grade Assessment: II - A patient with                            mild systemic disease. After reviewing the risks                            and benefits, the patient was deemed in                            satisfactory condition to undergo the procedure.                           After obtaining informed consent, the colonoscope  was passed under direct vision. Throughout the                            procedure, the patient's blood pressure, pulse, and                            oxygen saturations were monitored continuously. The                            Olympus PFC-[id number] (#[id number]) Colonoscope was                            introduced through the anus and advanced to the the                            cecum,  identified by appendiceal orifice and                            ileocecal valve. The colonoscopy was performed                            without difficulty. The patient tolerated the                            procedure well. The quality of the bowel                            preparation was excellent. The ileocecal valve,                            appendiceal orifice, and rectum were photographed. Scope In: 10:48:00 AM Scope Out: 10:59:07 AM Scope Withdrawal Time: 0 hours 7 minutes 39 seconds  Total Procedure Duration: 0 hours 11 minutes 7 seconds  Findings:                 The perianal and digital rectal examinations were                            normal.                           A less than 1 mm polyp was found in the transverse                            colon. The polyp was sessile. The polyp was removed                            with a cold biopsy forceps. Resection and retrieval                            were complete.                           A 7 mm polyp was found in the sigmoid colon. The  polyp was sessile. The polyp was removed with a                            cold snare. Resection and retrieval were complete.                           A few small-mouthed diverticula were found in the                            sigmoid colon.                           Non-bleeding internal hemorrhoids were found during                            retroflexion. The hemorrhoids were medium-sized. Complications:            No immediate complications. Estimated Blood Loss:     Estimated blood loss was minimal. Impression:               - One less than 1 mm polyp in the transverse colon,                            removed with a cold biopsy forceps. Resected and                            retrieved.                           - One 7 mm polyp in the sigmoid colon, removed with                            a cold snare. Resected and retrieved.                            - Diverticulosis in the sigmoid colon.                           - Non-bleeding internal hemorrhoids. Recommendation:           - Patient has a contact number available for                            emergencies. The signs and symptoms of potential                            delayed complications were discussed with the                            patient. Return to normal activities tomorrow.                            Written discharge instructions were provided to the                            patient.                           -  Resume previous diet.                           - Continue present medications.                           - Await pathology results.                           - Repeat colonoscopy in 5-10 years for surveillance                            based on pathology results. Mauri Pole, MD 08/15/2020 11:03:05 AM This report has been signed electronically.

## 2020-08-15 NOTE — Progress Notes (Signed)
Called to room to assist during endoscopic procedure.  Patient ID and intended procedure confirmed with present staff. Received instructions for my participation in the procedure from the performing physician.  

## 2020-08-15 NOTE — Progress Notes (Signed)
Pt's states no medical or surgical changes since previsit or office visit. 

## 2020-08-15 NOTE — Patient Instructions (Signed)
Handouts on polyps, hemorrhoids, and diverticulosis given to you today  Await pathology results from Dr. Nandigam   YOU HAD AN ENDOSCOPIC PROCEDURE TODAY AT THE [facility]:   Refer to the procedure report that was given to you for any specific questions about what was found during the examination.  If the procedure report does not answer your questions, please call your gastroenterologist to clarify.  If you requested that your care partner not be given the details of your procedure findings, then the procedure report has been included in a sealed envelope for you to review at your convenience later.  YOU SHOULD EXPECT: Some feelings of bloating in the abdomen. Passage of more gas than usual.  Walking can help get rid of the air that was put into your GI tract during the procedure and reduce the bloating. If you had a lower endoscopy (such as a colonoscopy or flexible sigmoidoscopy) you may notice spotting of blood in your stool or on the toilet paper. If you underwent a bowel prep for your procedure, you may not have a normal bowel movement for a few days.  Please Note:  You might notice some irritation and congestion in your nose or some drainage.  This is from the oxygen used during your procedure.  There is no need for concern and it should clear up in a day or so.  SYMPTOMS TO REPORT IMMEDIATELY:   Following lower endoscopy (colonoscopy or flexible sigmoidoscopy):  Excessive amounts of blood in the stool  Significant tenderness or worsening of abdominal pains  Swelling of the abdomen that is new, acute  Fever of 100F or higher  For urgent or emergent issues, a gastroenterologist can be reached at any hour by calling [phone]. Do not use MyChart messaging for urgent concerns.    DIET:  We do recommend a small meal at first, but then you may proceed to your regular diet.  Drink plenty of fluids but you should avoid alcoholic beverages for 24 hours.  ACTIVITY:  You  should plan to take it easy for the rest of today and you should NOT DRIVE or use heavy machinery until tomorrow (because of the sedation medicines used during the test).    FOLLOW UP: Our staff will call the number listed on your records 48-72 hours following your procedure to check on you and address any questions or concerns that you may have regarding the information given to you following your procedure. If we do not reach you, we will leave a message.  We will attempt to reach you two times.  During this call, we will ask if you have developed any symptoms of COVID 19. If you develop any symptoms (ie: fever, flu-like symptoms, shortness of breath, cough etc.) before then, please call [phone].  If you test positive for Covid 19 in the 2 weeks post procedure, please call and report this information to us.    If any biopsies were taken you will be contacted by phone or by letter within the next 1-3 weeks.  Please call us at [phone] if you have not heard about the biopsies in 3 weeks.    SIGNATURES/CONFIDENTIALITY: You and/or your care partner have signed paperwork which will be entered into your electronic medical record.  These signatures attest to the fact that that the information above on your After Visit Summary has been reviewed and is understood.  Full responsibility of the confidentiality of this discharge information lies with you and/or your care-partner. 

## 2020-08-17 ENCOUNTER — Telehealth: Payer: Self-pay

## 2020-08-17 NOTE — Telephone Encounter (Signed)
  Follow up Call-  Call back number 08/15/2020  Post procedure Call Back phone  # [phone]  Permission to leave phone message Yes  Some recent data might be hidden     Patient questions:  Do you have a fever, pain , or abdominal swelling? No. Pain Score  0 *  Have you tolerated food without any problems? Yes.    Have you been able to return to your normal activities? Yes.    Do you have any questions about your discharge instructions: Diet   No. Medications  No. Follow up visit  No.  Do you have questions or concerns about your Care? No.  Actions: * If pain score is 4 or above: No action needed, pain <4. 1. Have you developed a fever since your procedure? no  2.   Have you had an respiratory symptoms (SOB or cough) since your procedure? no  3.   Have you tested positive for COVID 19 since your procedure no  4.   Have you had any family members/close contacts diagnosed with the COVID 19 since your procedure?  no   If yes to any of these questions please route to Joylene John, RN and Joella Prince, RN

## 2020-08-21 ENCOUNTER — Encounter: Payer: Self-pay | Admitting: Gastroenterology

## 2020-10-30 DIAGNOSIS — N951 Menopausal and female climacteric states: Secondary | ICD-10-CM | POA: Diagnosis not present

## 2020-10-30 DIAGNOSIS — Z86718 Personal history of other venous thrombosis and embolism: Secondary | ICD-10-CM | POA: Diagnosis not present

## 2020-10-30 DIAGNOSIS — N926 Irregular menstruation, unspecified: Secondary | ICD-10-CM | POA: Diagnosis not present

## 2020-11-19 ENCOUNTER — Telehealth: Payer: Self-pay | Admitting: Nurse Practitioner

## 2020-11-19 NOTE — Telephone Encounter (Signed)
Pt scheduled an appt for tomorrow morning but asked if the nurse could please call her back today, please advise

## 2020-11-19 NOTE — Telephone Encounter (Signed)
Pt states she has been having issues wit fatigue and headaches with reminds her of how she felt when she was having iron issues. Pt states she took a home COVID test that was negative. Pt just wanted to confirm she needed to come in for an appointment. Patient encouraged to keep appointment for tomorrow morning

## 2020-11-20 ENCOUNTER — Encounter: Payer: Self-pay | Admitting: Nurse Practitioner

## 2020-11-20 ENCOUNTER — Other Ambulatory Visit: Payer: Self-pay

## 2020-11-20 ENCOUNTER — Ambulatory Visit (INDEPENDENT_AMBULATORY_CARE_PROVIDER_SITE_OTHER): Payer: BC Managed Care – PPO | Admitting: Nurse Practitioner

## 2020-11-20 VITALS — BP 110/60 | HR 64 | Temp 97.0°F | Ht 65.0 in | Wt 230.8 lb

## 2020-11-20 DIAGNOSIS — J069 Acute upper respiratory infection, unspecified: Secondary | ICD-10-CM

## 2020-11-20 DIAGNOSIS — Z20822 Contact with and (suspected) exposure to covid-19: Secondary | ICD-10-CM | POA: Diagnosis not present

## 2020-11-20 NOTE — Progress Notes (Signed)
Subjective:  Patient ID: Sarah Hudson, female    DOB: 1965-04-07  Age: 55 y.o. MRN: [id number]  CC: Acute Visit (Pt c/o headaches, fatigue, congestion and swollen lymph nodes x 4 days. Pt took at home COVID test yesterday morning and it was negative)  URI  This is a new problem. The current episode started in the past 7 days. The problem has been unchanged. There has been no fever. Associated symptoms include congestion, headaches, joint pain, nausea and swollen glands. Pertinent negatives include no abdominal pain, coughing, diarrhea, dysuria, ear pain, joint swelling, neck pain, plugged ear sensation, rash, rhinorrhea, sinus pain, sneezing, sore throat, vomiting or wheezing. She has tried nothing for the symptoms.  recieived 3covid vaccines. Denies any recent travel.  Reviewed past Medical, Social and Family history today.  Outpatient Medications Prior to Visit  Medication Sig Dispense Refill   ELDERBERRY PO Take 1 tablet by mouth 2 (two) times daily as needed (supplement).     ibuprofen (ADVIL) 200 MG tablet Take 400 mg by mouth every 6 (six) hours as needed for moderate pain.      ibuprofen (ADVIL) 800 MG tablet Take 800 mg by mouth 3 (three) times daily.     Multiple Vitamin (MULTIVITAMIN WITH MINERALS) TABS tablet Take 2 tablets by mouth daily.     cephALEXin (KEFLEX) 500 MG capsule Take 500 mg by mouth 3 (three) times daily. (Patient not taking: No sig reported)     chlorhexidine (PERIDEX) 0.12 % solution SMARTSIG:0.5 Ounce(s) By Mouth Twice Daily (Patient not taking: No sig reported)     No facility-administered medications prior to visit.   ROS See HPI  Objective:  BP 110/60 (BP Location: Right Arm, Patient Position: Sitting, Cuff Size: Normal)   Pulse 64   Temp (!) 97 F (36.1 C) (Temporal)   Ht '5\' 5"'$  (1.651 m)   Wt 230 lb 12.8 oz (104.7 kg)   SpO2 97%   BMI 38.41 kg/m   Physical Exam Vitals reviewed.  Constitutional:      General: She is not in acute  distress. HENT:     Head:     Jaw: No trismus, tenderness, swelling or malocclusion.     Salivary Glands: Right salivary gland is not diffusely enlarged or tender. Left salivary gland is not diffusely enlarged or tender.     Right Ear: Tympanic membrane, ear canal and external ear normal. There is no impacted cerumen.     Left Ear: Tympanic membrane, ear canal and external ear normal. There is no impacted cerumen.  Cardiovascular:     Rate and Rhythm: Normal rate and regular rhythm.     Pulses: Normal pulses.     Heart sounds: Normal heart sounds.  Pulmonary:     Effort: Pulmonary effort is normal.     Breath sounds: Normal breath sounds.  Neurological:     Mental Status: She is alert and oriented to person, place, and time.   Assessment & Plan:  This visit occurred during the SARS-CoV-2 public health emergency.  Safety protocols were in place, including screening questions prior to the visit, additional usage of staff PPE, and extensive cleaning of exam room while observing appropriate contact time as indicated for disinfecting solutions.   Valerie was seen today for acute visit.  Diagnoses and all orders for this visit:  Viral upper respiratory tract infection Advised about supportive care and go for PCR COVID test.  Problem List Items Addressed This Visit   None Visit Diagnoses  Viral upper respiratory tract infection    -  Primary       Follow-up: No follow-ups on file.  Wilfred Lacy, NP

## 2020-11-20 NOTE — Patient Instructions (Signed)
Schedule appt for COVID PCR test through a retail pharmacy.  URI Instructions: Flonase and Afrin use: apply 1spray of afrin in each nare, wait 58mns, then apply 2sprays of flonase in each nare. Use both nasal spray consecutively x 3days, then flonase only for at least 14days. Encourage adequate oral hydration. Use over-the-counter  "cold" medicines  such as "Tylenol cold" , "Advil cold",  "Mucinex" or" Mucinex D"  for cough and congestion.  Avoid decongestants if you have high blood pressure. Use" Delsym" or" Robitussin" cough syrup varietis for cough.  You can use plain "Tylenol" or "Advi"l for fever, chills and achyness.  Viral symptoms are usually triggered by a virus.  The antibiotics are usually not necessary. On average, a" viral cold" illness would take 4-7 days to resolve. Please, make an appointment if you are not better or if you're worse.

## 2020-11-22 ENCOUNTER — Telehealth: Payer: Self-pay | Admitting: Nurse Practitioner

## 2020-11-22 NOTE — Telephone Encounter (Signed)
Patient states she completed COVID test, it was negative and she is feeling better.

## 2020-12-13 ENCOUNTER — Encounter: Payer: Self-pay | Admitting: Nurse Practitioner

## 2020-12-13 ENCOUNTER — Telehealth: Payer: Self-pay | Admitting: Nurse Practitioner

## 2020-12-13 DIAGNOSIS — R239 Unspecified skin changes: Secondary | ICD-10-CM

## 2020-12-13 NOTE — Telephone Encounter (Signed)
Spoke to patient, she will send amy chart message with pictures attached to forward to [facility] to   advise on a dermatology referral. Dm/cma

## 2021-01-24 DIAGNOSIS — L218 Other seborrheic dermatitis: Secondary | ICD-10-CM | POA: Diagnosis not present

## 2021-01-24 DIAGNOSIS — D492 Neoplasm of unspecified behavior of bone, soft tissue, and skin: Secondary | ICD-10-CM | POA: Diagnosis not present

## 2021-01-24 DIAGNOSIS — C44329 Squamous cell carcinoma of skin of other parts of face: Secondary | ICD-10-CM | POA: Diagnosis not present

## 2021-01-24 DIAGNOSIS — L239 Allergic contact dermatitis, unspecified cause: Secondary | ICD-10-CM | POA: Diagnosis not present

## 2021-02-15 ENCOUNTER — Encounter: Payer: Self-pay | Admitting: Nurse Practitioner

## 2021-02-15 DIAGNOSIS — C44329 Squamous cell carcinoma of skin of other parts of face: Secondary | ICD-10-CM

## 2021-02-15 DIAGNOSIS — Z85828 Personal history of other malignant neoplasm of skin: Secondary | ICD-10-CM | POA: Insufficient documentation

## 2021-02-15 HISTORY — DX: Squamous cell carcinoma of skin of other parts of face: C44.329

## 2021-04-29 DIAGNOSIS — L281 Prurigo nodularis: Secondary | ICD-10-CM | POA: Diagnosis not present

## 2021-04-29 DIAGNOSIS — L218 Other seborrheic dermatitis: Secondary | ICD-10-CM | POA: Diagnosis not present

## 2021-04-29 DIAGNOSIS — L249 Irritant contact dermatitis, unspecified cause: Secondary | ICD-10-CM | POA: Diagnosis not present

## 2021-04-29 DIAGNOSIS — Z08 Encounter for follow-up examination after completed treatment for malignant neoplasm: Secondary | ICD-10-CM | POA: Diagnosis not present

## 2021-05-04 IMAGING — US US ABDOMEN LIMITED RUQ/ASCITES
1 series · 6 of 6 positions shown · non-contrast
Comparison: None.

CLINICAL DATA: Small epigastric wall mass

EXAM:
ULTRASOUND ABDOMEN LIMITED

[Series 1: us abdomen limited ruq/ascites · 0.06mm/px · 6 acquisitions, 6 frames shown]
[im 1/6]
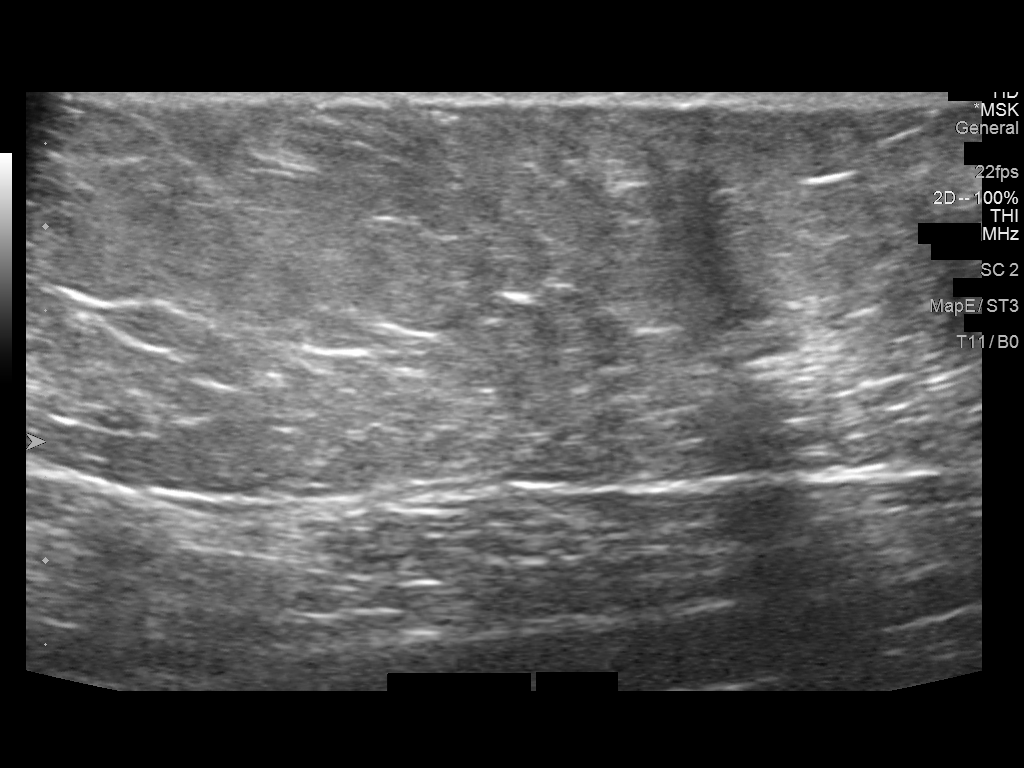
[im 2/6]
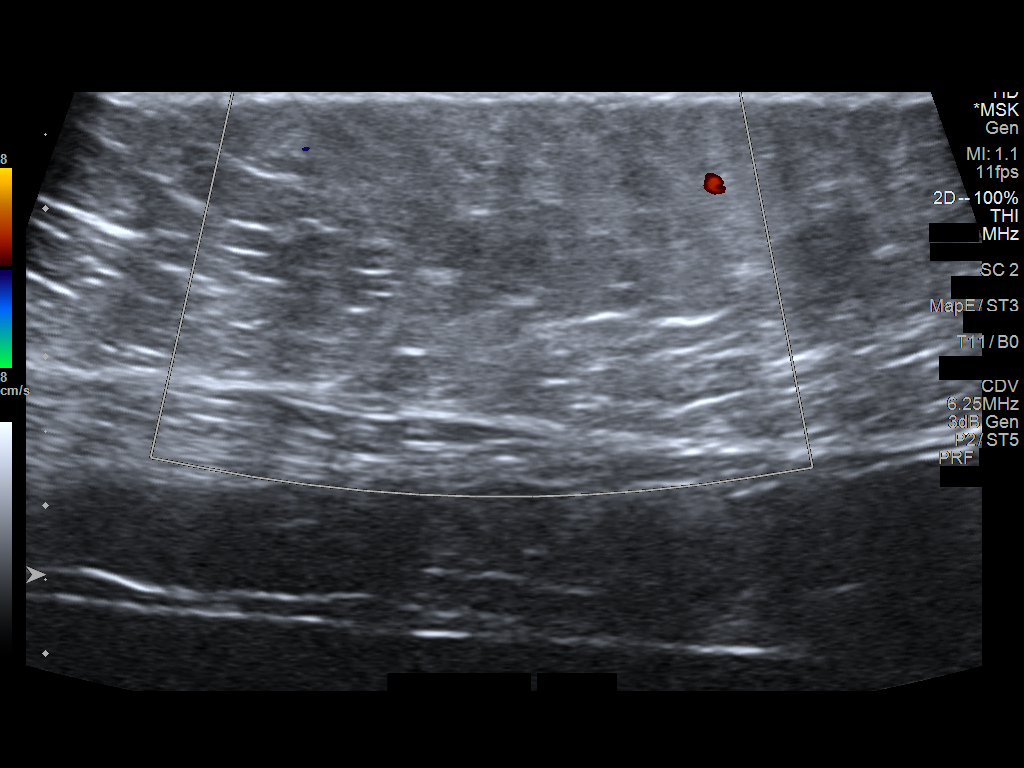
[im 3/6]
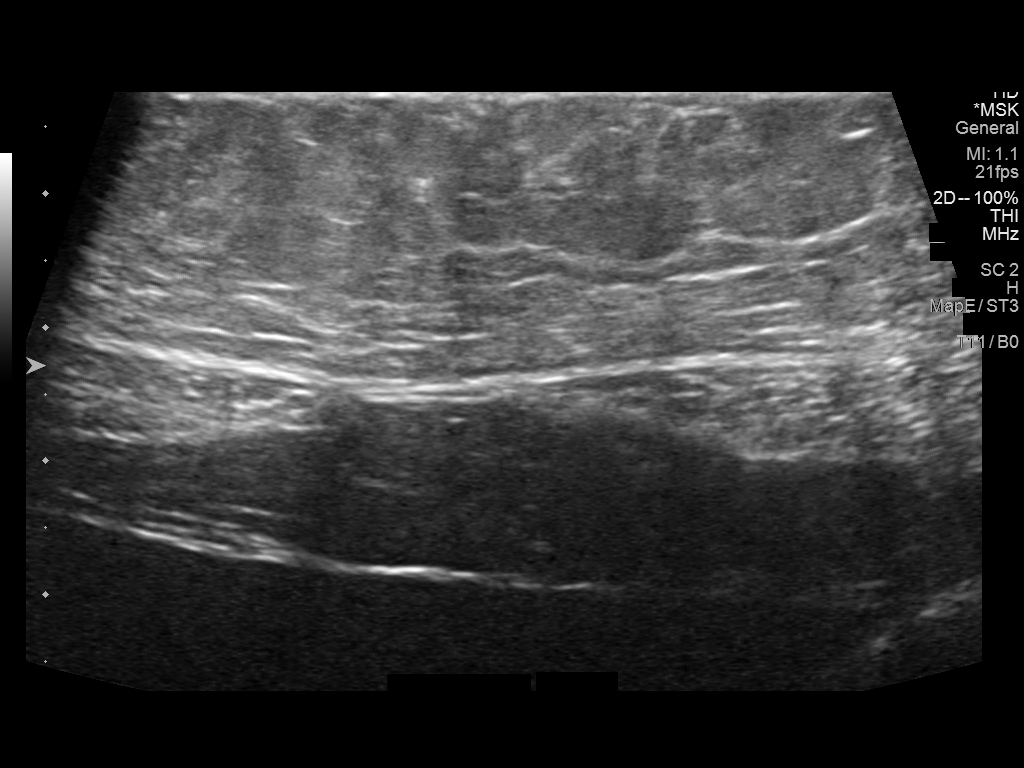
[im 4/6]
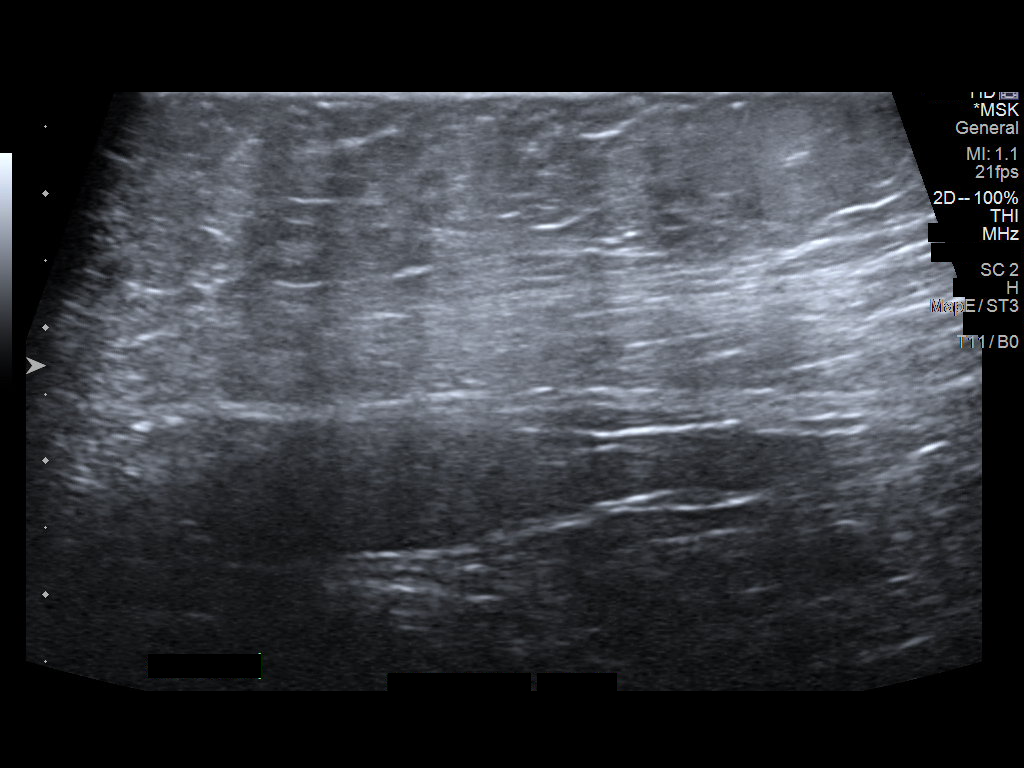
[im 5/6]
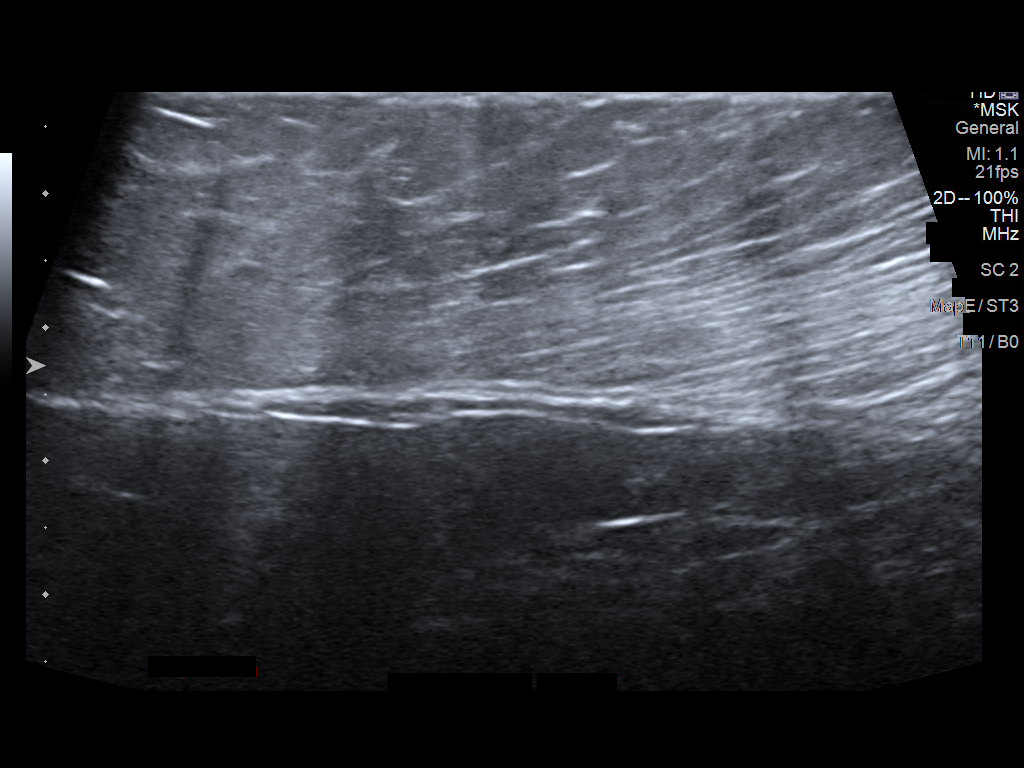
[im 6/6]
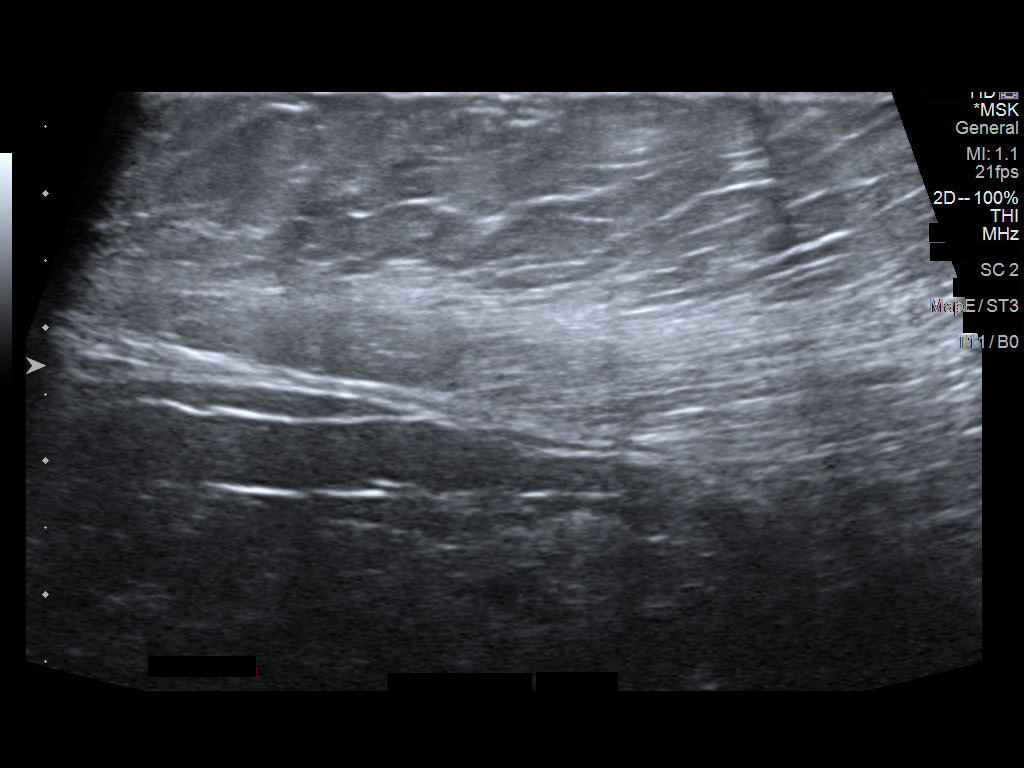

[6 of 6 positions shown; findings below may reference images not displayed]

FINDINGS: No solid or cystic mass visualized. No abdominal wall hernia
visualized.
IMPRESSION: No sonographic abnormality in the patient's palpable area of
concern.

## 2021-06-10 DIAGNOSIS — L438 Other lichen planus: Secondary | ICD-10-CM | POA: Diagnosis not present

## 2021-06-10 DIAGNOSIS — Z08 Encounter for follow-up examination after completed treatment for malignant neoplasm: Secondary | ICD-10-CM | POA: Diagnosis not present

## 2021-06-10 DIAGNOSIS — L281 Prurigo nodularis: Secondary | ICD-10-CM | POA: Diagnosis not present

## 2021-06-10 DIAGNOSIS — L218 Other seborrheic dermatitis: Secondary | ICD-10-CM | POA: Diagnosis not present

## 2021-06-10 DIAGNOSIS — Z86007 Personal history of in-situ neoplasm of skin: Secondary | ICD-10-CM | POA: Diagnosis not present

## 2021-07-08 ENCOUNTER — Encounter: Payer: BC Managed Care – PPO | Admitting: Nurse Practitioner

## 2021-07-15 ENCOUNTER — Encounter: Payer: Self-pay | Admitting: Nurse Practitioner

## 2021-07-15 ENCOUNTER — Ambulatory Visit (INDEPENDENT_AMBULATORY_CARE_PROVIDER_SITE_OTHER): Payer: BC Managed Care – PPO | Admitting: Nurse Practitioner

## 2021-07-15 VITALS — BP 128/70 | HR 76 | Temp 97.3°F | Ht 64.25 in | Wt 241.4 lb

## 2021-07-15 DIAGNOSIS — Z136 Encounter for screening for cardiovascular disorders: Secondary | ICD-10-CM

## 2021-07-15 DIAGNOSIS — I2609 Other pulmonary embolism with acute cor pulmonale: Secondary | ICD-10-CM | POA: Insufficient documentation

## 2021-07-15 DIAGNOSIS — D6859 Other primary thrombophilia: Secondary | ICD-10-CM

## 2021-07-15 DIAGNOSIS — Z23 Encounter for immunization: Secondary | ICD-10-CM | POA: Diagnosis not present

## 2021-07-15 DIAGNOSIS — Z0001 Encounter for general adult medical examination with abnormal findings: Secondary | ICD-10-CM | POA: Diagnosis not present

## 2021-07-15 DIAGNOSIS — E669 Obesity, unspecified: Secondary | ICD-10-CM | POA: Insufficient documentation

## 2021-07-15 DIAGNOSIS — Z13220 Encounter for screening for lipoid disorders: Secondary | ICD-10-CM

## 2021-07-15 DIAGNOSIS — Z6838 Body mass index (BMI) 38.0-38.9, adult: Secondary | ICD-10-CM

## 2021-07-15 LAB — CBC WITH DIFFERENTIAL/PLATELET
Basophils Absolute: 0 10*3/uL (ref 0.0–0.1)
Basophils Relative: 0.7 % (ref 0.0–3.0)
Eosinophils Absolute: 0.2 10*3/uL (ref 0.0–0.7)
Eosinophils Relative: 2.6 % (ref 0.0–5.0)
HCT: 41.1 % (ref 36.0–46.0)
Hemoglobin: 13.5 g/dL (ref 12.0–15.0)
Lymphocytes Relative: 31 % (ref 12.0–46.0)
Lymphs Abs: 1.9 10*3/uL (ref 0.7–4.0)
MCHC: 32.8 g/dL (ref 30.0–36.0)
MCV: 90.1 fl (ref 78.0–100.0)
Monocytes Absolute: 0.6 10*3/uL (ref 0.1–1.0)
Monocytes Relative: 9.4 % (ref 3.0–12.0)
Neutro Abs: 3.4 10*3/uL (ref 1.4–7.7)
Neutrophils Relative %: 56.3 % (ref 43.0–77.0)
Platelets: 231 10*3/uL (ref 150.0–400.0)
RBC: 4.56 Mil/uL (ref 3.87–5.11)
RDW: 14.8 % (ref 11.5–15.5)
WBC: 6 10*3/uL (ref 4.0–10.5)

## 2021-07-15 LAB — COMPREHENSIVE METABOLIC PANEL
ALT: 14 U/L (ref 0–35)
AST: 17 U/L (ref 0–37)
Albumin: 4.2 g/dL (ref 3.5–5.2)
Alkaline Phosphatase: 69 U/L (ref 39–117)
BUN: 10 mg/dL (ref 6–23)
CO2: 27 mEq/L (ref 19–32)
Calcium: 9 mg/dL (ref 8.4–10.5)
Chloride: 106 mEq/L (ref 96–112)
Creatinine, Ser: 0.83 mg/dL (ref 0.40–1.20)
GFR: 80.41 mL/min (ref >60.00)
Glucose, Bld: 110 mg/dL — ABNORMAL HIGH (ref 70–99)
Potassium: 3.7 mEq/L (ref 3.5–5.1)
Sodium: 140 mEq/L (ref 135–145)
Total Bilirubin: 0.4 mg/dL (ref 0.2–1.2)
Total Protein: 7.4 g/dL (ref 6.0–8.3)

## 2021-07-15 LAB — LIPID PANEL
Cholesterol: 193 mg/dL (ref 0–200)
HDL: 54.7 mg/dL (ref >39.00)
LDL Cholesterol: 101 mg/dL — ABNORMAL HIGH (ref 0–99)
NonHDL: 138.08
Total CHOL/HDL Ratio: 4
Triglycerides: 186 mg/dL — ABNORMAL HIGH (ref 0.0–149.0)
VLDL: 37.2 mg/dL (ref 0.0–40.0)

## 2021-07-15 LAB — TSH: TSH: 0.98 u[IU]/mL (ref 0.35–5.50)

## 2021-07-15 NOTE — Progress Notes (Signed)
? ?Complete physical exam ? ?Patient: Sarah Hudson   DOB: 1965-07-29   56 y.o. Female  MRN: [id number] ?Visit Date: 07/15/2021 ? ?Subjective:  ?  ?Chief Complaint  ?Patient presents with  ? Annual Exam  ?  Physical-No breast or pap exam needed, pt has GYN.  ?Pt is not fasting.  ?Tdap and shingles vaccine given today.   ? ?Sarah Hudson is a 56 y.o. female who presents today for a complete physical exam. She reports consuming a general diet.  No exercise  She generally feels well. She reports sleeping well. She does not have additional problems to discuss today.  ?Vision:Yes ?Dental:Yes ?STD Screen:No ? ?Wt Readings from Last 3 Encounters:  ?07/15/21 241 lb 6.4 oz (109.5 kg)  ?11/20/20 230 lb 12.8 oz (104.7 kg)  ?08/15/20 225 lb (102.1 kg)  ?  ?Most recent fall risk assessment: ? ?  07/05/2020  ? 10:32 AM  ?Fall Risk   ?Falls in the past year? 0  ?Number falls in past yr: 0  ?Injury with Fall? 0  ?Risk for fall due to : No Fall Risks  ?Follow up Falls evaluation completed  ? ?Most recent depression screenings: ? ?  07/15/2021  ?  2:46 PM 07/05/2020  ? 12:52 PM  ?PHQ 2/9 Scores  ?PHQ - 2 Score 0 0  ?PHQ- 9 Score 2 1  ? ? ?HPI  ?Hypercoagulable state ([place]) ?Hx of DVT and PE ?Unable to use xarelto due to menorrhagia ?Use of ASA '81mg'$  daily ? ?Past Medical History:  ?Diagnosis Date  ? Anemia   ? Chicken pox   ? Clotting disorder ([place])   ? hx blood clots   ? Hx of blood clots   ? 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 ?Past Surgical History:  ?Procedure Laterality Date  ? INCISION AND DRAINAGE Left 10/20/2017  ? Procedure: INCISION AND DRAINAGE LEFT LONG FINGER;  Surgeon: Leanora Cover, MD;  Location: [place];  Service: Orthopedics;  Laterality: Left;  ? TUBAL LIGATION    ? UMBILICAL HERNIA REPAIR    ? ?Social History  ? ?Socioeconomic History  ? Marital status: Married  ?  Spouse name: Not on file  ? Number of children: 7  ? Years of education: 28  ? Highest education [place]: Not on file  ?Occupational  History  ? Occupation: Optometrist  ?Tobacco Use  ? Smoking status: Never  ? Smokeless tobacco: Never  ?Vaping Use  ? Vaping Use: Never used  ?Substance and Sexual Activity  ? Alcohol use: Not Currently  ?  Comment: Occasional  ? Drug use: No  ? Sexual activity: Yes  ?  Birth control/protection: Post-menopausal, Surgical  ?  Comment: s/p tubal ligation  ?Other Topics Concern  ? Not on file  ?Social History Narrative  ? Fun: Travel  ? Denies religious beliefs effecting health care  ? Feels safe at home and denies abuse  ? ?Social Determinants of Health  ? ?Financial Resource Strain: Not on file  ?Food Insecurity: Not on file  ?Transportation Needs: Not on file  ?Physical Activity: Not on file  ?Stress: Not on file  ?Social Connections: Not on file  ?Intimate Partner Violence: Not on file  ? ?Family Status  ?Relation Name Status  ? Mother  Alive  ? Father  Alive  ? MGM  Alive  ? MGF  Deceased  ? PGM  Deceased  ? PGF  Deceased  ? Neg Hx  (Not Specified)  ? ?  Family History  ?Problem Relation Age of Onset  ? Arthritis Mother   ? Hypertension Mother   ? Diabetes Mother   ? Hypertension Father   ? Colon polyps Father   ? Heart attack Maternal Grandfather   ?     50's  ? Colon cancer Neg Hx   ? Esophageal cancer Neg Hx   ? Rectal cancer Neg Hx   ? Stomach cancer Neg Hx   ? ?No Known Allergies  ?Patient Care Team: ?Woodford Strege, Charlene Brooke, NP as PCP - General (Internal Medicine) ?Everett Graff, MD as Consulting Physician (Obstetrics and Gynecology)  ? ?Medications: ?Outpatient Medications Prior to Visit  ?Medication Sig  ? ELDERBERRY PO Take 1 tablet by mouth 2 (two) times daily as needed (supplement).  ? ibuprofen (ADVIL) 200 MG tablet Take 400 mg by mouth every 6 (six) hours as needed for moderate pain.  ? Multiple Vitamin (MULTIVITAMIN WITH MINERALS) TABS tablet Take 2 tablets by mouth daily.  ? [DISCONTINUED] ibuprofen (ADVIL) 800 MG tablet Take 800 mg by mouth 3 (three) times daily. (Patient not taking: Reported on  07/15/2021)  ? ?No facility-administered medications prior to visit.  ? ? ?Review of Systems  ?Constitutional:  Negative for fever.  ?HENT:  Negative for congestion and sore throat.   ?Eyes:   ?     Negative for visual changes  ?Respiratory:  Negative for cough and shortness of breath.   ?Cardiovascular:  Negative for chest pain, palpitations and leg swelling.  ?Gastrointestinal:  Negative for blood in stool, constipation and diarrhea.  ?Genitourinary:  Negative for dysuria, frequency and urgency.  ?Musculoskeletal:  Negative for myalgias.  ?Skin:  Negative for rash.  ?Neurological:  Negative for dizziness and headaches.  ?Hematological:  Does not bruise/bleed easily.  ?Psychiatric/Behavioral:  Negative for suicidal ideas. The patient is not nervous/anxious.   ? ? ?   ?Objective:  ?BP 128/70 (BP Location: Left Arm, Patient Position: Sitting, Cuff Size: Large)   Pulse 76   Temp (!) 97.3 ?F (36.3 ?C) (Temporal)   Ht 5' 4.25" (1.632 m)   Wt 241 lb 6.4 oz (109.5 kg)   LMP 07/15/2021 (Exact Date)   SpO2 98%   BMI 41.11 kg/m?  ?  ? ? ? ?Physical Exam ?Vitals reviewed.  ?Constitutional:   ?   General: She is not in acute distress. ?   Appearance: She is well-developed.  ?HENT:  ?   Right Ear: Tympanic membrane, ear canal and external ear normal.  ?   Left Ear: Tympanic membrane, ear canal and external ear normal.  ?   Nose: Nose normal.  ?   Mouth/Throat:  ?   Pharynx: No oropharyngeal exudate.  ?Eyes:  ?   Conjunctiva/sclera: Conjunctivae normal.  ?   Pupils: Pupils are equal, round, and reactive to light.  ?Cardiovascular:  ?   Rate and Rhythm: Normal rate and regular rhythm.  ?   Pulses: Normal pulses.  ?   Heart sounds: Normal heart sounds.  ?Pulmonary:  ?   Effort: Pulmonary effort is normal. No respiratory distress.  ?   Breath sounds: Normal breath sounds.  ?Chest:  ?   Chest wall: No tenderness.  ?Musculoskeletal:     ?   General: Normal range of motion.  ?   Cervical back: Normal range of motion and neck  supple.  ?   Right lower leg: No edema.  ?   Left lower leg: No edema.  ?Neurological:  ?   Mental Status:  She is alert and oriented to person, place, and time.  ?   Deep Tendon Reflexes: Reflexes are normal and symmetric.  ?  ?No results found for any visits on 07/15/21. ?   ?Assessment & Plan:  ?  ?Routine Health Maintenance and Physical Exam ? ?Immunization History  ?Administered Date(s) Administered  ? PFIZER(Purple Top)SARS-COV-2 Vaccination 06/02/2019, 06/29/2019, 02/20/2020  ? Tdap 07/15/2021  ? Zoster Recombinat (Shingrix) 07/15/2021  ? ? ?Health Maintenance  ?Topic Date Due  ? PAP SMEAR-Modifier  Never done  ? COVID-19 Vaccine (4 - Booster for Pfizer series) 04/16/2020  ? Hepatitis C Screening  07/16/2022 (Originally 12/22/1983)  ? Zoster Vaccines- Shingrix (2 of 2) 09/09/2021  ? INFLUENZA VACCINE  10/29/2021  ? MAMMOGRAM  11/22/2021  ? COLONOSCOPY (Pts 45-72yr Insurance coverage will need to be confirmed)  08/16/2027  ? TETANUS/TDAP  07/16/2031  ? HIV Screening  Completed  ? HPV VACCINES  Aged Out  ? ?Discussed health benefits of physical activity, and encouraged her to engage in regular exercise appropriate for her age and condition. ? ?Problem List Items Addressed This Visit   ? ?  ? Other  ? Hypercoagulable state ([place]  ?  Hx of DVT and PE ?Unable to use xarelto due to menorrhagia ?Use of ASA '81mg'$  daily ? ?  ?  ? Obesity  ? ?Other Visit Diagnoses   ? ? Encounter for preventative adult health care exam with abnormal findings    -  Primary  ? Relevant Orders  ? Comprehensive metabolic panel  ? Lipid panel  ? TSH  ? CBC with Differential/Platelet  ? Encounter for lipid screening for cardiovascular disease      ? Relevant Orders  ? Lipid panel  ? Need for shingles vaccine      ? Relevant Orders  ? Varicella-zoster vaccine IM (Completed)  ? Need for diphtheria-tetanus-pertussis (Tdap) vaccine      ? Relevant Orders  ? Tdap vaccine greater than or equal to 7yo IM (Completed)  ? ?  ? ?Return in about 1 year  (around 07/16/2022) for CPE (fasting). ? ?  ? ?CWilfred Lacy NP ?

## 2021-07-15 NOTE — Assessment & Plan Note (Addendum)
Hx of DVT and PE ?Unable to use xarelto due to menorrhagia ?Use of ASA '81mg'$  daily ?

## 2021-07-15 NOTE — Patient Instructions (Signed)
Go to lab for blood draw ?Start heart healthy diet and regular exercise ?Have PAP and mammogram report faxed to me. ? ?Preventive Care 21-54 Years Old, Female ?Preventive care refers to lifestyle choices and visits with your health care provider that can promote health and wellness. Preventive care visits are also called wellness exams. ?What can I expect for my preventive care visit? ?Counseling ?Your health care provider may ask you questions about your: ?Medical history, including: ?Past medical problems. ?Family medical history. ?Pregnancy history. ?Current health, including: ?Menstrual cycle. ?Method of birth control. ?Emotional well-being. ?Home life and relationship well-being. ?Sexual activity and sexual health. ?Lifestyle, including: ?Alcohol, nicotine or tobacco, and drug use. ?Access to firearms. ?Diet, exercise, and sleep habits. ?Work and work Statistician. ?Sunscreen use. ?Safety issues such as seatbelt and bike helmet use. ?Physical exam ?Your health care provider will check your: ?Height and weight. These may be used to calculate your BMI (body mass index). BMI is a measurement that tells if you are at a healthy weight. ?Waist circumference. This measures the distance around your waistline. This measurement also tells if you are at a healthy weight and may help predict your risk of certain diseases, such as type 2 diabetes and high blood pressure. ?Heart rate and blood pressure. ?Body temperature. ?Skin for abnormal spots. ?What immunizations do I need? ? ?Vaccines are usually given at various ages, according to a schedule. Your health care provider will recommend vaccines for you based on your age, medical history, and lifestyle or other factors, such as travel or where you work. ?What tests do I need? ?Screening ?Your health care provider may recommend screening tests for certain conditions. This may include: ?Lipid and cholesterol levels. ?Diabetes screening. This is done by checking your blood  sugar (glucose) after you have not eaten for a while (fasting). ?Pelvic exam and Pap test. ?Hepatitis B test. ?Hepatitis C test. ?HIV (human immunodeficiency virus) test. ?STI (sexually transmitted infection) testing, if you are at risk. ?Lung cancer screening. ?Colorectal cancer screening. ?Mammogram. Talk with your health care provider about when you should start having regular mammograms. This may depend on whether you have a family history of breast cancer. ?BRCA-related cancer screening. This may be done if you have a family history of breast, ovarian, tubal, or peritoneal cancers. ?Bone density scan. This is done to screen for osteoporosis. ?Talk with your health care provider about your test results, treatment options, and if necessary, the need for more tests. ?Follow these instructions at home: ?Eating and drinking ? ?Eat a diet that includes fresh fruits and vegetables, whole grains, lean protein, and low-fat dairy products. ?Take vitamin and mineral supplements as recommended by your health care provider. ?Do not drink alcohol if: ?Your health care provider tells you not to drink. ?You are pregnant, may be pregnant, or are planning to become pregnant. ?If you drink alcohol: ?Limit how much you have to 0-1 drink a day. ?Know how much alcohol is in your drink. In the [place] one drink equals one 12 oz bottle of beer (355 mL), one 5 oz glass of wine (148 mL), or one 1? oz glass of hard liquor (44 mL). ?Lifestyle ?Brush your teeth every morning and night with fluoride toothpaste. Floss one time each day. ?Exercise for at least 30 minutes 5 or more days each week. ?Do not use any products that contain nicotine or tobacco. These products include cigarettes, chewing tobacco, and vaping devices, such as e-cigarettes. If you need help quitting, ask your health care  provider. ?Do not use drugs. ?If you are sexually active, practice safe sex. Use a condom or other form of protection to prevent STIs. ?If you do not  wish to become pregnant, use a form of birth control. If you plan to become pregnant, see your health care provider for a prepregnancy visit. ?Take aspirin only as told by your health care provider. Make sure that you understand how much to take and what form to take. Work with your health care provider to find out whether it is safe and beneficial for you to take aspirin daily. ?Find healthy ways to manage stress, such as: ?Meditation, yoga, or listening to music. ?Journaling. ?Talking to a trusted person. ?Spending time with friends and family. ?Minimize exposure to UV radiation to reduce your risk of skin cancer. ?Safety ?Always wear your seat belt while driving or riding in a vehicle. ?Do not drive: ?If you have been drinking alcohol. Do not ride with someone who has been drinking. ?When you are tired or distracted. ?While texting. ?If you have been using any mind-altering substances or drugs. ?Wear a helmet and other protective equipment during sports activities. ?If you have firearms in your house, make sure you follow all gun safety procedures. ?Seek help if you have been physically or sexually abused. ?What's next? ?Visit your health care provider once a year for an annual wellness visit. ?Ask your health care provider how often you should have your eyes and teeth checked. ?Stay up to date on all vaccines. ?This information is not intended to replace advice given to you by your health care provider. Make sure you discuss any questions you have with your health care provider. ?Document Revised: 09/12/2020 Document Reviewed: 09/12/2020 ?Elsevier Patient Education ? [place]. ? ?

## 2021-10-07 DIAGNOSIS — N921 Excessive and frequent menstruation with irregular cycle: Secondary | ICD-10-CM | POA: Diagnosis not present

## 2021-10-07 DIAGNOSIS — Z01419 Encounter for gynecological examination (general) (routine) without abnormal findings: Secondary | ICD-10-CM | POA: Diagnosis not present

## 2021-10-07 DIAGNOSIS — N760 Acute vaginitis: Secondary | ICD-10-CM | POA: Diagnosis not present

## 2021-10-28 DIAGNOSIS — N930 Postcoital and contact bleeding: Secondary | ICD-10-CM | POA: Diagnosis not present

## 2021-12-11 DIAGNOSIS — L218 Other seborrheic dermatitis: Secondary | ICD-10-CM | POA: Diagnosis not present

## 2021-12-11 DIAGNOSIS — L821 Other seborrheic keratosis: Secondary | ICD-10-CM | POA: Diagnosis not present

## 2021-12-11 DIAGNOSIS — D492 Neoplasm of unspecified behavior of bone, soft tissue, and skin: Secondary | ICD-10-CM | POA: Diagnosis not present

## 2021-12-11 DIAGNOSIS — L438 Other lichen planus: Secondary | ICD-10-CM | POA: Diagnosis not present

## 2021-12-11 DIAGNOSIS — L281 Prurigo nodularis: Secondary | ICD-10-CM | POA: Diagnosis not present

## 2021-12-19 ENCOUNTER — Other Ambulatory Visit: Payer: Self-pay | Admitting: Obstetrics and Gynecology

## 2021-12-19 DIAGNOSIS — N939 Abnormal uterine and vaginal bleeding, unspecified: Secondary | ICD-10-CM | POA: Diagnosis not present

## 2022-04-03 DIAGNOSIS — M25562 Pain in left knee: Secondary | ICD-10-CM | POA: Diagnosis not present

## 2022-04-03 DIAGNOSIS — M25571 Pain in right ankle and joints of right foot: Secondary | ICD-10-CM | POA: Diagnosis not present

## 2022-04-19 ENCOUNTER — Emergency Department (HOSPITAL_COMMUNITY)
Admission: EM | Admit: 2022-04-19 | Discharge: 2022-04-19 | Disposition: A | Discharge status: Home / Self Care | Payer: BC Managed Care – PPO | Attending: Emergency Medicine | Admitting: Emergency Medicine

## 2022-04-19 ENCOUNTER — Telehealth: Payer: Self-pay

## 2022-04-19 ENCOUNTER — Other Ambulatory Visit: Payer: Self-pay

## 2022-04-19 ENCOUNTER — Encounter (HOSPITAL_COMMUNITY): Payer: Self-pay

## 2022-04-19 ENCOUNTER — Emergency Department (HOSPITAL_BASED_OUTPATIENT_CLINIC_OR_DEPARTMENT_OTHER)
Admit: 2022-04-19 | Discharge: 2022-04-19 | Disposition: A | Discharge status: Home / Self Care | Payer: BC Managed Care – PPO

## 2022-04-19 DIAGNOSIS — I82442 Acute embolism and thrombosis of left tibial vein: Secondary | ICD-10-CM | POA: Diagnosis not present

## 2022-04-19 DIAGNOSIS — Z7901 Long term (current) use of anticoagulants: Secondary | ICD-10-CM | POA: Diagnosis not present

## 2022-04-19 DIAGNOSIS — M79662 Pain in left lower leg: Secondary | ICD-10-CM | POA: Diagnosis not present

## 2022-04-19 DIAGNOSIS — R52 Pain, unspecified: Secondary | ICD-10-CM | POA: Diagnosis not present

## 2022-04-19 DIAGNOSIS — I82402 Acute embolism and thrombosis of unspecified deep veins of left lower extremity: Secondary | ICD-10-CM | POA: Diagnosis not present

## 2022-04-19 LAB — CBC WITH DIFFERENTIAL/PLATELET
Abs Immature Granulocytes: 0.02 10*3/uL (ref 0.00–0.07)
Basophils Absolute: 0.1 10*3/uL (ref 0.0–0.1)
Basophils Relative: 1 %
Eosinophils Absolute: 0.2 10*3/uL (ref 0.0–0.5)
Eosinophils Relative: 4 %
HCT: 40.1 % (ref 36.0–46.0)
Hemoglobin: 13.3 g/dL (ref 12.0–15.0)
Immature Granulocytes: 0 %
Lymphocytes Relative: 31 %
Lymphs Abs: 1.6 10*3/uL (ref 0.7–4.0)
MCH: 30.4 pg (ref 26.0–34.0)
MCHC: 33.2 g/dL (ref 30.0–36.0)
MCV: 91.8 fL (ref 80.0–100.0)
Monocytes Absolute: 0.5 10*3/uL (ref 0.1–1.0)
Monocytes Relative: 9 %
Neutro Abs: 2.8 10*3/uL (ref 1.7–7.7)
Neutrophils Relative %: 55 %
Platelets: 204 10*3/uL (ref 150–400)
RBC: 4.37 MIL/uL (ref 3.87–5.11)
RDW: 14.1 % (ref 11.5–15.5)
WBC: 5.1 10*3/uL (ref 4.0–10.5)
nRBC: 0 % (ref 0.0–0.2)

## 2022-04-19 LAB — BASIC METABOLIC PANEL
Anion gap: 9 (ref 5–15)
BUN: 6 mg/dL (ref 6–20)
CO2: 24 mmol/L (ref 22–32)
Calcium: 8.9 mg/dL (ref 8.9–10.3)
Chloride: 107 mmol/L (ref 98–111)
Creatinine, Ser: 0.76 mg/dL (ref 0.44–1.00)
GFR, Estimated: >60 mL/min (ref >60)
Glucose, Bld: 118 mg/dL — ABNORMAL HIGH (ref 70–99)
Potassium: 4.1 mmol/L (ref 3.5–5.1)
Sodium: 140 mmol/L (ref 135–145)

## 2022-04-19 MED ORDER — RIVAROXABAN (XARELTO) VTE STARTER PACK (15 & 20 MG): 15.0000 mg | ORAL_TABLET | Freq: Two times a day (BID) | ORAL | 0 refills | Status: DC

## 2022-04-19 NOTE — ED Triage Notes (Signed)
Patient reports left calf pain with left knee swelling onset Wednesday this week , denies injury , history of blood clots , she is not taking anticoagulant .

## 2022-04-19 NOTE — Discharge Instructions (Addendum)
Please avoid using any other blood thinning medications including "NSAIDS," which include ibuprofen or Motrin, aspirin, Aleve, Goody's powder.  It is safe to take Tylenol for pain.

## 2022-04-19 NOTE — ED Provider Notes (Signed)
[facility] Provider Note   CSN: [id number] Arrival date & time: 04/19/22  7341     History  Chief Complaint  Patient presents with   Left Calf Pain     Sarah Hudson is a 57 y.o. female with a history of of DVT presented ED with left calf pain, spontaneous, about 3 days ago.  Tightness in her lower leg.  She has a history of clots in the past and had been on Xarelto, most recently a few years ago, but these clots were felt to be provoked, most recently due to a long travel.  She has not had any recent surgeries, no estrogen use, no prolonged travel or immobilization  HPI     Home Medications Prior to Admission medications   Medication Sig Start Date End Date Taking? Authorizing Provider  ELDERBERRY PO Take 1 tablet by mouth 2 (two) times daily as needed (supplement).    [provider]  ibuprofen (ADVIL) 200 MG tablet Take 400 mg by mouth every 6 (six) hours as needed for moderate pain.    [provider]  Multiple Vitamin (MULTIVITAMIN WITH MINERALS) TABS tablet Take 2 tablets by mouth daily.    [provider]      Allergies    Patient has no known allergies.    Review of Systems   Review of Systems  Physical Exam Updated Vital Signs BP (!) 152/96 (BP Location: Left Arm)   Pulse 71   Temp 98.2 F (36.8 C)   Resp 18   SpO2 100%  Physical Exam Constitutional:      General: She is not in acute distress. HENT:     Head: Normocephalic and atraumatic.  Eyes:     Conjunctiva/sclera: Conjunctivae normal.     Pupils: Pupils are equal, round, and reactive to light.  Cardiovascular:     Rate and Rhythm: Normal rate and regular rhythm.  Pulmonary:     Effort: Pulmonary effort is normal. No respiratory distress.  Abdominal:     General: There is no distension.     Tenderness: There is no abdominal tenderness.  Musculoskeletal:        General: Tenderness present.     Comments: Left lower calf pain   Skin:    General: Skin is warm and dry.  Neurological:     General: No focal deficit present.     Mental Status: She is alert. Mental status is at baseline.  Psychiatric:        Mood and Affect: Mood normal.        Behavior: Behavior normal.     ED Results / Procedures / Treatments   Labs (all labs ordered are listed, but only abnormal results are displayed) Labs Reviewed  BASIC METABOLIC PANEL - Abnormal; Notable for the following components:      Result Value   Glucose, Bld 118 (*)    All other components within normal limits  CBC WITH DIFFERENTIAL/PLATELET    EKG None  Radiology VAS [place] LOWER EXTREMITY VENOUS (DVT) (7a-7p)  Result Date: 04/19/2022  Lower Venous DVT Study Patient Name:  [place]  Date of Exam:   04/19/2022 Medical Rec #: [id number]      Accession #:    [id number] Date of Birth: 1965/04/23      Patient Gender: F Patient Age:   30 years Exam Location:  [facility] Procedure:      VAS [place] LOWER EXTREMITY VENOUS (DVT)  Referring Phys: Suella Broad --------------------------------------------------------------------------------  Indications: Pain.  Risk Factors: None identified. Limitations: Poor ultrasound/tissue interface. Comparison Study: No prior studies. Performing Technologist: Oliver Hum RVT  Examination Guidelines: A complete evaluation includes B-mode imaging, spectral Doppler, color Doppler, and power Doppler as needed of all accessible portions of each vessel. Bilateral testing is considered an integral part of a complete examination. Limited examinations for reoccurring indications may be performed as noted. The reflux portion of the exam is performed with the patient in reverse Trendelenburg.  +-----+---------------+---------+-----------+----------+--------------+ RIGHTCompressibilityPhasicitySpontaneityPropertiesThrombus Aging +-----+---------------+---------+-----------+----------+--------------+ CFV  Full           Yes      Yes                                  +-----+---------------+---------+-----------+----------+--------------+   +---------+---------------+---------+-----------+----------+--------------+ LEFT     CompressibilityPhasicitySpontaneityPropertiesThrombus Aging +---------+---------------+---------+-----------+----------+--------------+ CFV      Full           Yes      Yes                                 +---------+---------------+---------+-----------+----------+--------------+ SFJ      Full                                                        +---------+---------------+---------+-----------+----------+--------------+ FV Prox  Full                                                        +---------+---------------+---------+-----------+----------+--------------+ FV Mid   Full                                                        +---------+---------------+---------+-----------+----------+--------------+ FV DistalFull                                                        +---------+---------------+---------+-----------+----------+--------------+ PFV      Full                                                        +---------+---------------+---------+-----------+----------+--------------+ POP      Full           Yes      Yes                                 +---------+---------------+---------+-----------+----------+--------------+ PTV      Partial  Acute          +---------+---------------+---------+-----------+----------+--------------+ PERO     Full                                                        +---------+---------------+---------+-----------+----------+--------------+    Summary: RIGHT: - No evidence of common femoral vein obstruction.  LEFT: - Findings consistent with acute deep vein thrombosis involving the left posterior tibial veins. - No cystic structure found in the popliteal fossa.  *See table(s) above for  measurements and observations.    Preliminary     Procedures Procedures    Medications Ordered in ED Medications - No data to display  ED Course/ Medical Decision Making/ A&P                             Medical Decision Making Amount and/or Complexity of Data Reviewed Labs: ordered.   Ddx with left calf pain include DVT vs cellulitis vs baker's cyst vs abscess  Patient's labs persevered interpreted and unremarkable.  DVT ultrasound also reviewed and interpreted showing an acute posterior tibial vein thrombosis.  This is an unprovoked DVT.  I will refer patient to hematology for office evaluation.  We will restart her on Xarelto, she is going to the pharmacy to pick it up today.  We discussed avoiding NSAIDs, or any other blood thinning medications, signs and symptoms of GI bleeding, and return to ED for any potential significant head trauma.  She and her husband verbalized understanding.  Okay for discharge.  At this time I have a low suspicion for acute PE.  The patient is not tachycardic or hypoxic.  She will be on a therapeutic dose of DOAC regardless.         Final Clinical Impression(s) / ED Diagnoses Final diagnoses:  Acute deep vein thrombosis (DVT) of tibial vein of left lower extremity [facility])    Rx / DC Orders ED Discharge Orders          Ordered    Ambulatory referral to Hematology / Oncology       Comments: Unprovoked DVT   04/19/22 1010              Wyvonnia Dusky, MD 04/19/22 1010

## 2022-04-19 NOTE — Telephone Encounter (Signed)
Patient called iin stating that the xaralto with insurance would be 900 dollars. Called pharmacy and they verified that, attempted to call in trial card, but it is once in a lifetime and she already used this back in 2017. Spoke to MD and changed prescription to Eliquis '5mg'$  twice daily. 30 day free card called in and the co-pay is 0 dollars. Encouraged patient to make a follow up appointment with PCP asap.

## 2022-04-19 NOTE — ED Provider Triage Note (Signed)
Emergency Medicine Provider Triage Evaluation Note  Sarah Hudson , a 57 y.o. female  was evaluated in triage.  Pt complains of left calf pain, prior DVT, thought to be travel related and not on thinners. Onset Wednesday night. No swelling but normally doesn't swell.  Left knee pain a few weeks ago, had cortisone shot then, knee swollen anteriorly.  No recent travel. Review of Systems  Positive: As above Negative: As above, no SHOB, CP  Physical Exam  BP (!) 148/90   Pulse 79   Temp (!) 97.5 F (36.4 C) (Oral)   Resp 18   SpO2 96%  Gen:   Awake, no distress   Resp:  Normal effort  MSK:   Moves extremities without difficulty  Other:  TTP left calf  Medical Decision Making  Medically screening exam initiated at 6:48 AM.  Appropriate orders placed.  Zena Vitelli Streicher was informed that the remainder of the evaluation will be completed by another provider, this initial triage assessment does not replace that evaluation, and the importance of remaining in the ED until their evaluation is complete.     Tacy Learn, PA-C 04/19/22 [phone]

## 2022-04-19 NOTE — Progress Notes (Signed)
Left lower extremity venous duplex has been completed. Preliminary results can be found in CV Proc through chart review.  Results were given to Suella Broad PA.  04/19/22 8:49 AM Carlos Levering RVT

## 2022-04-19 NOTE — Telephone Encounter (Signed)
Paitent and husband called in to state the pharmacy did not have [place] they would like to see if [facility] [address] has it and it can be called in

## 2022-04-21 ENCOUNTER — Telehealth: Payer: Self-pay

## 2022-04-21 NOTE — Patient Outreach (Addendum)
  Care Coordination TOC Note Transition Care Management Unsuccessful Follow-up Telephone Call  Date of discharge and from where:  [facility] 04/19/22[facility]  Attempts:  1st Attempt  Reason for unsuccessful TCM follow-up call:  No answer/busy  Johnney Killian, RN, BSN, CCM Care Management Coordinator [facility] Phone: [phone]: [phone]

## 2022-04-22 ENCOUNTER — Telehealth: Payer: Self-pay

## 2022-04-22 NOTE — Patient Outreach (Signed)
  Care Coordination TOC Note Transition Care Management Unsuccessful Follow-up Telephone Call  Date of discharge and from where:  [facility] 04/19/22  Attempts:  2nd Attempt  Reason for unsuccessful TCM follow-up call:  Unable to leave message- unidentified voicemail.  Johnney Killian, RN, BSN, CCM Care Management Coordinator [place] Phone: [phone]: [phone]

## 2022-04-23 ENCOUNTER — Telehealth: Payer: Self-pay

## 2022-04-23 NOTE — Patient Outreach (Signed)
  Care Coordination TOC Note Transition Care Management Unsuccessful Follow-up Telephone Call  Date of discharge and from where:  [facility] 04/19/22  Attempts:  3rd Attempt  Reason for unsuccessful TCM follow-up call:  No answer/busy

## 2022-05-05 ENCOUNTER — Inpatient Hospital Stay: Payer: BC Managed Care – PPO

## 2022-05-05 ENCOUNTER — Other Ambulatory Visit: Payer: Self-pay

## 2022-05-05 ENCOUNTER — Inpatient Hospital Stay: Payer: BC Managed Care – PPO | Attending: Hematology | Admitting: Hematology

## 2022-05-05 VITALS — BP 134/66 | HR 66 | Temp 97.5°F | Resp 18 | Wt 176.1 lb

## 2022-05-05 DIAGNOSIS — Z7901 Long term (current) use of anticoagulants: Secondary | ICD-10-CM | POA: Diagnosis not present

## 2022-05-05 DIAGNOSIS — Z85828 Personal history of other malignant neoplasm of skin: Secondary | ICD-10-CM | POA: Diagnosis not present

## 2022-05-05 DIAGNOSIS — I82442 Acute embolism and thrombosis of left tibial vein: Secondary | ICD-10-CM

## 2022-05-05 DIAGNOSIS — D500 Iron deficiency anemia secondary to blood loss (chronic): Secondary | ICD-10-CM | POA: Diagnosis not present

## 2022-05-05 DIAGNOSIS — D6859 Other primary thrombophilia: Secondary | ICD-10-CM

## 2022-05-05 LAB — CBC WITH DIFFERENTIAL (CANCER CENTER ONLY)
Abs Immature Granulocytes: 0.01 10*3/uL (ref 0.00–0.07)
Basophils Absolute: 0.1 10*3/uL (ref 0.0–0.1)
Basophils Relative: 1 %
Eosinophils Absolute: 0.1 10*3/uL (ref 0.0–0.5)
Eosinophils Relative: 2 %
HCT: 41.1 % (ref 36.0–46.0)
Hemoglobin: 13.4 g/dL (ref 12.0–15.0)
Immature Granulocytes: 0 %
Lymphocytes Relative: 32 %
Lymphs Abs: 1.8 10*3/uL (ref 0.7–4.0)
MCH: 29.7 pg (ref 26.0–34.0)
MCHC: 32.6 g/dL (ref 30.0–36.0)
MCV: 91.1 fL (ref 80.0–100.0)
Monocytes Absolute: 0.6 10*3/uL (ref 0.1–1.0)
Monocytes Relative: 11 %
Neutro Abs: 3 10*3/uL (ref 1.7–7.7)
Neutrophils Relative %: 54 %
Platelet Count: 290 10*3/uL (ref 150–400)
RBC: 4.51 MIL/uL (ref 3.87–5.11)
RDW: 14 % (ref 11.5–15.5)
WBC Count: 5.5 10*3/uL (ref 4.0–10.5)
nRBC: 0 % (ref 0.0–0.2)

## 2022-05-05 LAB — CMP (CANCER CENTER ONLY)
ALT: 11 U/L (ref 0–44)
AST: 15 U/L (ref 15–41)
Albumin: 4.1 g/dL (ref 3.5–5.0)
Alkaline Phosphatase: 60 U/L (ref 38–126)
Anion gap: 5 (ref 5–15)
BUN: 9 mg/dL (ref 6–20)
CO2: 28 mmol/L (ref 22–32)
Calcium: 9.4 mg/dL (ref 8.9–10.3)
Chloride: 108 mmol/L (ref 98–111)
Creatinine: 0.86 mg/dL (ref 0.44–1.00)
GFR, Estimated: >60 mL/min (ref >60)
Glucose, Bld: 86 mg/dL (ref 70–99)
Potassium: 3.9 mmol/L (ref 3.5–5.1)
Sodium: 141 mmol/L (ref 135–145)
Total Bilirubin: 0.5 mg/dL (ref 0.3–1.2)
Total Protein: 7.6 g/dL (ref 6.5–8.1)

## 2022-05-05 LAB — D-DIMER, QUANTITATIVE: D-Dimer, Quant: 0.65 ug/mL-FEU — ABNORMAL HIGH (ref 0.00–0.50)

## 2022-05-05 LAB — FERRITIN: Ferritin: 201 ng/mL (ref 11–307)

## 2022-05-05 LAB — ANTITHROMBIN III: AntiThromb III Func: 105 % (ref 75–120)

## 2022-05-05 NOTE — Progress Notes (Signed)
HEMATOLOGY/ONCOLOGY CONSULTATION NOTE  Date of Service: 05/05/2022  Patient Care Team: Nche, Charlene Brooke, NP as PCP - General (Internal Medicine) Everett Graff, MD as Consulting Physician (Obstetrics and Gynecology)  CHIEF COMPLAINTS/PURPOSE OF CONSULTATION:  DVT  HISTORY OF PRESENTING ILLNESS:   Sarah Hudson is a wonderful 57 y.o. female who has been referred to [place] by in patient ED physician, Dr. Octaviano Glow, for evaluation and management of DVT of acute posterior vein thrombosis.   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 Patient has a PMHx significant for left lower extremity (left peroneal) DVT in feb 2016 which she reports occurred after a road trip to and from [place]. She was was placed on Xarelto and reports that she took her Xarelto for only about 8monthand discontinued it and did not followup. She reports that she was having severe menorrhagia which is another reason she stopped it.  She subsequently after another road trip in Oct 2016 developed an acute LLE DVT and Rt sided PE's and was placed back on Xarelto which she has been taking since. She was admitted to the hospital in 07/2015 with symptomatic anemia with hgb of 6.7. She received 2 units of PRBC and IV iron. hgb was up to 10.1 with MCV of 73.9. Patient continues to be on oral iron. She notes that her periods continue to be heavy and wants to get off the Xarelto ASAP.  She had a rpt [place] which showed resolution of previous left peroneal DVT and no new DVT. She reports her father had a DVT but that it was in the setting of obesity, immobility and with no known thrombophilia.  In May of 2017, we recommended hypercoagulability testing. Patient decided she wanted to discontinue anticoagulation despite the results of hypercoag workup and therefore there was a decision not to proceed with hypercoag since it would not change decision  regarging anticoagulation. Patient was recommended to continue baby aspirin.   Patient is here today because she recently had blood clot last month, January 2024. Patient notes she had knee pain around the first week of January and that is where she had steroid injection. She reports no other changes other than the knee pain and steroid injection.   She presented at the ED on 04/19/2022 due to left calf pain and tightness in her lower leg. Her DVT ultrasound showed an acute posterior tibial vein thrombosis. Patient is unsure of what provoked this recent blood clot. She denies of long travel or any other life changes.   Patient reports she was not getting menstrual cycle for 8 months until recently that lasted 4 days.   She denies any new medical changes, new medicines, or any new life style changes. She does report she is currently trying to lose weight.  Patient is currently taking Eliquis 5 mg twice a day since last month. She notes she has not been taking aspirin since 2017.   She complains of consistent left knee pain, but denies left calf pain. Patient denies any injuries to the knee, but does think that she might have twisted her knee.   Patient denies fever, chills, night sweat, unexpected weight loss, abdominal pain, chest pain, or leg swelling.    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    SURGICAL HISTORY: Past Surgical History:  Procedure Laterality Date   INCISION AND DRAINAGE Left 10/20/2017   Procedure: INCISION AND DRAINAGE LEFT LONG FINGER;  Surgeon: Leanora Cover, MD;  Location: [facility];  Service: Orthopedics;  Laterality: Left;   TUBAL LIGATION     UMBILICAL HERNIA REPAIR      SOCIAL HISTORY: Social History   Socioeconomic History   Marital status: Married    Spouse name: Not on file    Number of children: 7   Years of education: 16   Highest education [place]: Not on file  Occupational History   Occupation: Optometrist  Tobacco Use   Smoking status: Never   Smokeless tobacco: Never  Vaping Use   Vaping Use: Never used  Substance and Sexual Activity   Alcohol use: Not Currently    Comment: Occasional   Drug use: No   Sexual activity: Yes    Birth control/protection: Post-menopausal, Surgical    Comment: s/p tubal ligation  Other Topics Concern   Not on file  Social History Narrative   Fun: Travel   Denies religious beliefs effecting health care   Feels safe at home and denies abuse   Social Determinants of Health   Financial Resource Strain: Not on file  Food Insecurity: Not on file  Transportation Needs: Not on file  Physical Activity: Not on file  Stress: Not on file  Social Connections: Not on file  Intimate Partner Violence: Not on file    FAMILY HISTORY: Family History  Problem Relation Age of Onset   Arthritis Mother    Hypertension Mother    Diabetes Mother    Hypertension Father    Colon polyps Father    Heart attack Maternal Grandfather        50's   Colon cancer Neg Hx    Esophageal cancer Neg Hx    Rectal cancer Neg Hx    Stomach cancer Neg Hx     ALLERGIES:  has No Known Allergies.  MEDICATIONS:  Current Outpatient Medications  Medication Sig Dispense Refill   ELDERBERRY PO Take 1 tablet by mouth 2 (two) times daily as needed (supplement).     ibuprofen (ADVIL) 200 MG tablet Take 400 mg by mouth every 6 (six) hours as needed for moderate pain.     Multiple Vitamin (MULTIVITAMIN WITH MINERALS) TABS tablet Take 2 tablets by mouth daily.     RIVAROXABAN (XARELTO) VTE STARTER PACK (15 & 20 MG) Take 15 mg by mouth 2 (two) times daily. Follow package directions: Take one 65m tablet by mouth twice a day. On day 22, switch to one 243mtablet once a day. Take with food. 51 each 0   No current facility-administered medications for this  visit.    REVIEW OF SYSTEMS:    10 Point review of Systems was done is negative except as noted above.  PHYSICAL EXAMINATION: ECOG PERFORMANCE STATUS: 1 - Symptomatic but completely ambulatory  . Vitals:   05/05/22 1109 05/05/22 1146  BP: (!) 146/84 134/66  Pulse: 75 66  Resp: 20 18  Temp: 97.7 F (36.5 C) (!) 97.5 F (36.4 C)  SpO2: 99% 98%   Filed Weights   05/05/22 1109 05/05/22 1146  Weight: 229 lb 8 oz (104.1 kg) 176 lb 1.6 oz (79.9 kg)   .Body mass index is 29.99 kg/m.  GENERAL:alert, in no acute distress and comfortable SKIN: no  acute rashes, no significant lesions EYES: conjunctiva are pink and non-injected, sclera anicteric OROPHARYNX: MMM, no exudates, no oropharyngeal erythema or ulceration NECK: supple, no JVD LYMPH:  no palpable lymphadenopathy in the cervical, axillary or inguinal regions LUNGS: clear to auscultation b/l with normal respiratory effort HEART: regular rate & rhythm ABDOMEN:  normoactive bowel sounds , non tender, not distended. Extremity: no pedal edema PSYCH: alert & oriented x 3 with fluent speech NEURO: no focal motor/sensory deficits  LABORATORY DATA:  I have reviewed the data as listed .    Latest Ref Rng & Units 05/05/2022   11:59 AM 04/19/2022    6:56 AM 07/15/2021    2:14 PM  CBC  WBC 4.0 - 10.5 K/uL 5.5  5.1  6.0   Hemoglobin 12.0 - 15.0 g/dL 13.4  13.3  13.5   Hematocrit 36.0 - 46.0 % 41.1  40.1  41.1   Platelets 150 - 400 K/uL 290  204  231.0    .    Latest Ref Rng & Units 05/05/2022   11:59 AM 04/19/2022    6:56 AM 07/15/2021    2:14 PM  CMP  Glucose 70 - 99 mg/dL 86  118  110   BUN 6 - 20 mg/dL 9  6  10   $ Creatinine 0.44 - 1.00 mg/dL 0.86  0.76  0.83   Sodium 135 - 145 mmol/L 141  140  140   Potassium 3.5 - 5.1 mmol/L 3.9  4.1  3.7   Chloride 98 - 111 mmol/L 108  107  106   CO2 22 - 32 mmol/L 28  24  27   $ Calcium 8.9 - 10.3 mg/dL 9.4  8.9  9.0   Total Protein 6.5 - 8.1 g/dL 7.6   7.4   Total Bilirubin 0.3 - 1.2  mg/dL 0.5   0.4   Alkaline Phos 38 - 126 U/L 60   69   AST 15 - 41 U/L 15   17   ALT 0 - 44 U/L 11   14      RADIOGRAPHIC STUDIES: I have personally reviewed the radiological images as listed and agreed with the findings in the report. VAS [place] LOWER EXTREMITY VENOUS (DVT) (7a-7p)  Result Date: 04/19/2022  Lower Venous DVT Study Patient Name:  ARYAH ENDERBY Kanitz  Date of Exam:   04/19/2022 Medical Rec #: [id number]      Accession #:    [id number] Date of Birth: Aug 17, 1965      Patient Gender: F Patient Age:   63 years Exam Location:  [facility] Procedure:      VAS [place] LOWER EXTREMITY VENOUS (DVT) Referring Phys: Suella Broad --------------------------------------------------------------------------------  Indications: Pain.  Risk Factors: None identified. Limitations: Poor ultrasound/tissue interface. Comparison Study: No prior studies. Performing Technologist: Oliver Hum RVT  Examination Guidelines: A complete evaluation includes B-mode imaging, spectral Doppler, color Doppler, and power Doppler as needed of all accessible portions of each vessel. Bilateral testing is considered an integral part of a complete examination. Limited examinations for reoccurring indications may be performed as noted. The reflux portion of the exam is performed with the patient in reverse Trendelenburg.  +-----+---------------+---------+-----------+----------+--------------+ RIGHTCompressibilityPhasicitySpontaneityPropertiesThrombus Aging +-----+---------------+---------+-----------+----------+--------------+ CFV  Full           Yes      Yes                                 +-----+---------------+---------+-----------+----------+--------------+   +---------+---------------+---------+-----------+----------+--------------+  LEFT     CompressibilityPhasicitySpontaneityPropertiesThrombus Aging +---------+---------------+---------+-----------+----------+--------------+ CFV      Full           Yes      Yes                                  +---------+---------------+---------+-----------+----------+--------------+ SFJ      Full                                                        +---------+---------------+---------+-----------+----------+--------------+ FV Prox  Full                                                        +---------+---------------+---------+-----------+----------+--------------+ FV Mid   Full                                                        +---------+---------------+---------+-----------+----------+--------------+ FV DistalFull                                                        +---------+---------------+---------+-----------+----------+--------------+ PFV      Full                                                        +---------+---------------+---------+-----------+----------+--------------+ POP      Full           Yes      Yes                                 +---------+---------------+---------+-----------+----------+--------------+ PTV      Partial                                      Acute          +---------+---------------+---------+-----------+----------+--------------+ PERO     Full                                                        +---------+---------------+---------+-----------+----------+--------------+    Summary: RIGHT: - No evidence of common femoral vein obstruction.  LEFT: - Findings consistent with acute deep vein thrombosis involving the left posterior tibial veins. - No cystic structure found in the popliteal fossa.  *See table(s) above for measurements and observations. Electronically signed by Harold Barban MD on 04/19/2022 at  10:16:49 AM.    Final     ASSESSMENT & PLAN:    57 yo with with    1) LLE DVT in 05/2014 provoked by an extensive car ride to [place] and back. Patient completed only 1 month of Xarelto and stop it due to menorhagia and decision not to followup for this   2) Acute LLE and Rt  sided PE in 12/2014 - likely clot progression due to incompletely treated previous clot and repeat long distance car journey.   Rpt [place] LLE on 07/31/2015 shows no evidence of residual clot Unclear h/o VTE in father in the setting of other acquired risk factors. Patient is having severe menorrhagia and has required hospitalization for symptomatic anemia - leading to PRBC transfusion and IV iron in 07/2015. She has previous gone through 7 pregnancies without and VTE making an inherited thrombophilia less likely   3) Now with Acute left posterior tibial vein DVT -- no clear provoking event noted. 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 -Discussed the next option would be to get labs today and genetic testing to see what is causing the blood clots.  -After lab results and genetic testing results, we will decide on starting preventative blood thinner or baby aspirin.  -Patient agrees to the plan.  -Continue Eliquis 5 mg twice a day for now.   FOLLOW-UP: Labs today Phone visit with Dr Irene Limbo in 2 weeks. .The total time spent in the appointment was 47 minutes* .  All of the patient's questions were answered with apparent satisfaction. The patient knows to call the clinic with any problems, questions or concerns.   Sullivan Lone MD MS AAHIVMS [facility] Hematology/Oncology Physician [facility]  .*Total Encounter Time as defined by the Centers for Medicare and Medicaid Services includes, in addition to the face-to-face time of a patient visit (documented in the note above) non-face-to-face time: obtaining and reviewing outside history, ordering and reviewing medications, tests or procedures, care coordination (communications with other health care professionals or caregivers) and documentation in the medical record.  05/05/2022 10:22 AM   I,Param Shah,acting as  a scribe for Sullivan Lone, MD.  .I have reviewed the above documentation for accuracy and completeness, and I agree with the above. Brunetta Genera MD

## 2022-05-06 LAB — HOMOCYSTEINE: Homocysteine: 11.2 umol/L (ref 0.0–14.5)

## 2022-05-07 LAB — DRVVT MIX: dRVVT Mix: 50.1 s — ABNORMAL HIGH (ref 0.0–40.4)

## 2022-05-07 LAB — DRVVT CONFIRM: dRVVT Confirm: 1.1 ratio (ref 0.8–1.2)

## 2022-05-07 LAB — BETA-2-GLYCOPROTEIN I ABS, IGG/M/A
Beta-2 Glyco I IgG: <9 GPI IgG units (ref 0–20)
Beta-2-Glycoprotein I IgA: <9 GPI IgA units (ref 0–25)
Beta-2-Glycoprotein I IgM: 36 GPI IgM units — ABNORMAL HIGH (ref 0–32)

## 2022-05-07 LAB — CARDIOLIPIN ANTIBODIES, IGG, IGM, IGA
Anticardiolipin IgA: <9 APL U/mL (ref 0–11)
Anticardiolipin IgG: <9 GPL U/mL (ref 0–14)
Anticardiolipin IgM: 13 MPL U/mL — ABNORMAL HIGH (ref 0–12)

## 2022-05-07 LAB — LUPUS ANTICOAGULANT PANEL
DRVVT: 65.4 s — ABNORMAL HIGH (ref 0.0–47.0)
PTT Lupus Anticoagulant: 45.5 s — ABNORMAL HIGH (ref 0.0–43.5)

## 2022-05-07 LAB — PROTEIN S ACTIVITY: Protein S Activity: 96 % (ref 63–140)

## 2022-05-07 LAB — PTT-LA MIX: PTT-LA Mix: 41.4 s — ABNORMAL HIGH (ref 0.0–40.5)

## 2022-05-07 LAB — PROTEIN C ACTIVITY: Protein C Activity: 100 % (ref 73–180)

## 2022-05-07 LAB — PROTEIN S, TOTAL: Protein S Ag, Total: 140 % (ref 60–150)

## 2022-05-07 LAB — HEXAGONAL PHASE PHOSPHOLIPID: Hexagonal Phase Phospholipid: 5 s (ref 0–11)

## 2022-05-09 LAB — PROTEIN C, TOTAL: Protein C, Total: 86 % (ref 60–150)

## 2022-05-12 LAB — FACTOR 5 LEIDEN

## 2022-05-13 LAB — PROTHROMBIN GENE MUTATION

## 2022-05-21 ENCOUNTER — Inpatient Hospital Stay (HOSPITAL_BASED_OUTPATIENT_CLINIC_OR_DEPARTMENT_OTHER): Payer: BC Managed Care – PPO | Admitting: Hematology

## 2022-05-21 DIAGNOSIS — Z7901 Long term (current) use of anticoagulants: Secondary | ICD-10-CM | POA: Diagnosis not present

## 2022-05-21 DIAGNOSIS — I82442 Acute embolism and thrombosis of left tibial vein: Secondary | ICD-10-CM | POA: Diagnosis not present

## 2022-05-21 DIAGNOSIS — D500 Iron deficiency anemia secondary to blood loss (chronic): Secondary | ICD-10-CM | POA: Diagnosis not present

## 2022-05-21 DIAGNOSIS — Z85828 Personal history of other malignant neoplasm of skin: Secondary | ICD-10-CM | POA: Diagnosis not present

## 2022-05-21 DIAGNOSIS — D6859 Other primary thrombophilia: Secondary | ICD-10-CM | POA: Diagnosis not present

## 2022-05-21 MED ORDER — ELIQUIS 5 MG PO TABS: 5.0000 mg | ORAL_TABLET | Freq: Two times a day (BID) | ORAL | 3 refills | Status: DC

## 2022-05-21 NOTE — Progress Notes (Signed)
HEMATOLOGY/ONCOLOGY CLINIC PHONE VISIT NOTE  Date of Service: 05/21/2022  Patient Care Team: Nche, Charlene Brooke, NP as PCP - General (Internal Medicine) Everett Graff, MD as Consulting Physician (Obstetrics and Gynecology)  CHIEF COMPLAINTS/PURPOSE OF CONSULTATION:  DVT  HISTORY OF PRESENTING ILLNESS:   Sarah Hudson is a wonderful 57 y.o. female who has been referred to [place] by in patient ED physician, Dr. Octaviano Glow, for evaluation and management of DVT of acute posterior vein thrombosis.   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 Patient has a PMHx significant for left lower extremity (left peroneal) DVT in feb 2016 which she reports occurred after a road trip to and from [place]. She was was placed on Xarelto and reports that she took her Xarelto for only about 47monthand discontinued it and did not followup. She reports that she was having severe menorrhagia which is another reason she stopped it.  She subsequently after another road trip in Oct 2016 developed an acute LLE DVT and Rt sided PE's and was placed back on Xarelto which she has been taking since. She was admitted to the hospital in 07/2015 with symptomatic anemia with hgb of 6.7. She received 2 units of PRBC and IV iron. hgb was up to 10.1 with MCV of 73.9. Patient continues to be on oral iron. She notes that her periods continue to be heavy and wants to get off the Xarelto ASAP.  She had [place]pt L[place] 07[place] which showed resolution of previous left peroneal DVT and no new DVT. She reports her father had a DVT but that it was in the setting of obesity, immobility and with no known thrombophilia.  In May of 2017, we recommended hypercoagulability testing. Patient decided she wanted to discontinue anticoagulation despite the results of hypercoag workup and therefore there was a decision not to proceed with hypercoag since it would not change  decision regarging anticoagulation. Patient was recommended to continue baby aspirin.   Patient is here today because she recently had blood clot last month, January 2024. Patient notes she had knee pain around the first week of January and that is where she had steroid injection. She reports no other changes other than the knee pain and steroid injection.   She presented at the ED on 04/19/2022 due to left calf pain and tightness in her lower leg. Her DVT ultrasound showed an acute posterior tibial vein thrombosis. Patient is unsure of what provoked this recent blood clot. She denies of long travel or any other life changes.   Patient reports she was not getting menstrual cycle for 8 months until recently that lasted 4 days.   She denies any new medical changes, new medicines, or any new life style changes. She does report she is currently trying to lose weight.  Patient is currently taking Eliquis 5 mg twice a day since last month. She notes she has not been taking aspirin since 2017.   She complains of consistent left knee pain, but denies left calf pain. Patient denies any injuries to the knee, but does think that she might have twisted her knee.   Patient denies fever, chills, night sweat, unexpected weight loss, abdominal pain, chest pain, or leg swelling.   INTERVAL HISTORY: Sarah AgualloLevel is a wonderful 57y.o. female who is contacted via phone for continued evaluation and management of DVT of acute posterior vein thrombosis. Patient was initially  seen by me on 05/05/2022.  .I connected with Petrona Ruckel Faron on 05/21/2022 at  3:30 PM EST by telephone visit and verified that I am speaking with the correct person using two identifiers.   Patient reports she has been doing well overall without any new medical concerns since our last visit. She denies fever, chills, night sweats, abnormal bowel movement, chest pain, back pain, or leg swelling.   We discussed lab results from 05/05/2022 in  detail with the patient.   I discussed the limitations, risks, security and privacy concerns of performing an evaluation and management service by telemedicine and the availability of in-person appointments. I also discussed with the patient that there may be a patient responsible charge related to this service. The patient expressed understanding and agreed to proceed.   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    SURGICAL HISTORY: Past Surgical History:  Procedure Laterality Date   INCISION AND DRAINAGE Left 10/20/2017   Procedure: INCISION AND DRAINAGE LEFT LONG FINGER;  Surgeon: Leanora Cover, MD;  Location: [facility];  Service: Orthopedics;  Laterality: Left;   TUBAL LIGATION     UMBILICAL HERNIA REPAIR      SOCIAL HISTORY: Social History   Socioeconomic History   Marital status: Married    Spouse name: Not on file   Number of children: 7   Years of education: 16   Highest education [place]: Not on file  Occupational History   Occupation: Optometrist  Tobacco Use   Smoking status: Never   Smokeless tobacco: Never  Vaping Use   Vaping Use: Never used  Substance and Sexual Activity   Alcohol use: Not Currently    Comment: Occasional   Drug use: No   Sexual activity: Yes    Birth control/protection: Post-menopausal, Surgical    Comment: s/p tubal ligation  Other Topics Concern   Not on file  Social History Narrative   Fun: Travel   Denies religious beliefs effecting health care   Feels safe at home and denies abuse   Social Determinants of Health   Financial Resource Strain: Not on file  Food Insecurity: Not on file   Transportation Needs: Not on file  Physical Activity: Not on file  Stress: Not on file  Social Connections: Not on file  Intimate Partner Violence: Not on file    FAMILY HISTORY: Family History  Problem Relation Age of Onset   Arthritis Mother    Hypertension Mother    Diabetes Mother    Hypertension Father    Colon polyps Father    Heart attack Maternal Grandfather        50's   Colon cancer Neg Hx    Esophageal cancer Neg Hx    Rectal cancer Neg Hx    Stomach cancer Neg Hx     ALLERGIES:  has No Known Allergies.  MEDICATIONS:  Current Outpatient Medications  Medication Sig Dispense Refill   ELDERBERRY PO Take 1 tablet by mouth 2 (two) times daily as needed (supplement).     ELIQUIS 5 MG TABS tablet Take 5 mg by mouth  2 (two) times daily.     ibuprofen (ADVIL) 200 MG tablet Take 400 mg by mouth every 6 (six) hours as needed for moderate pain. (Patient not taking: Reported on 05/05/2022)     Multiple Vitamin (MULTIVITAMIN WITH MINERALS) TABS tablet Take 2 tablets by mouth daily.     RIVAROXABAN (XARELTO) VTE STARTER PACK (15 & 20 MG) Take 15 mg by mouth 2 (two) times daily. Follow package directions: Take one '15mg'$  tablet by mouth twice a day. On day 22, switch to one '20mg'$  tablet once a day. Take with food. (Patient not taking: Reported on 05/05/2022) 51 each 0   No current facility-administered medications for this visit.    REVIEW OF SYSTEMS:    10 Point review of Systems was done is negative except as noted above.  PHYSICAL EXAMINATION: PHONE VISIT  LABORATORY DATA:  I have reviewed the data as listed .    Latest Ref Rng & Units 05/05/2022   11:59 AM 04/19/2022    6:56 AM 07/15/2021    2:14 PM  CBC  WBC 4.0 - 10.5 K/uL 5.5  5.1  6.0   Hemoglobin 12.0 - 15.0 g/dL 13.4  13.3  13.5   Hematocrit 36.0 - 46.0 % 41.1  40.1  41.1   Platelets 150 - 400 K/uL 290  204  231.0    .    Latest Ref Rng & Units 05/05/2022   11:59 AM 04/19/2022    6:56 AM 07/15/2021    2:14 PM   CMP  Glucose 70 - 99 mg/dL 86  118  110   BUN 6 - 20 mg/dL '9  6  10   '$ Creatinine 0.44 - 1.00 mg/dL 0.86  0.76  0.83   Sodium 135 - 145 mmol/L 141  140  140   Potassium 3.5 - 5.1 mmol/L 3.9  4.1  3.7   Chloride 98 - 111 mmol/L 108  107  106   CO2 22 - 32 mmol/L '28  24  27   '$ Calcium 8.9 - 10.3 mg/dL 9.4  8.9  9.0   Total Protein 6.5 - 8.1 g/dL 7.6   7.4   Total Bilirubin 0.3 - 1.2 mg/dL 0.5   0.4   Alkaline Phos 38 - 126 U/L 60   69   AST 15 - 41 U/L 15   17   ALT 0 - 44 U/L 11   14      RADIOGRAPHIC STUDIES: I have personally reviewed the radiological images as listed and agreed with the findings in the report. No results found.  ASSESSMENT & PLAN:    57 yo with with    1) LLE DVT in 05/2014 provoked by an extensive car ride to [place] and back. Patient completed only 1 month of Xarelto and stop it due to menorhagia and decision not to followup for this   2) Acute LLE and Rt sided PE in 12/2014 - likely clot progression due to incompletely treated previous clot and repeat long distance car journey.   Rpt [place] LLE on 07/31/2015 shows no evidence of residual clot Unclear h/o VTE in father in the setting of other acquired risk factors. Patient is having severe menorrhagia and has required hospitalization for symptomatic anemia - leading to PRBC transfusion and IV iron in 07/2015. She has previous gone through 7 pregnancies without and VTE making an inherited thrombophilia less likely   3) Now with Acute left posterior tibial vein DVT -- no clear provoking event noted. 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 CBC and CMP are stable. Antithrombin III levels normal. Ferritin Poth in the normal range at 201. D-dimer is slightly elevated at 0.65. IgM is slightly elevated at 13. Beta-2-Glycoprotein IgM is slightly elevated at 36. Protein S is in the normal range.  Protein C is in the normal range. Factor 5 leiden neg, prothrombin gene mutation neg. -educated the patient on beta-2-glycoprotein. -educated the patient that we need repeat lab work in 3 month for diagnosis. If the beta-2-glycoprotein levels are still elevated in 3 months, patient will have to continue blood thinner.  -Discussed the other option for no repeat lab work and to continue blood thinner long term.  -Patient agrees with repeat lab work in 3 months. -Educated the patient about D-dimer and what elevated D-dimer [place] means.  -Continue Eliquis 5 mg for 3 months until repeat lab work in 3 months.  FOLLOW-UP: Labs in 12 weeks Phone visit with Dr Irene Limbo in 14 weeks  The total time spent in the appointment was 20 minutes* .  All of the patient's questions were answered with apparent satisfaction. The patient knows to call the clinic with any problems, questions or concerns.   Sullivan Lone MD MS AAHIVMS [facility] Hematology/Oncology Physician [facility]  .*Total Encounter Time as defined by the Centers for Medicare and Medicaid Services includes, in addition to the face-to-face time of a patient visit (documented in the note above) non-face-to-face time: obtaining and reviewing outside history, ordering and reviewing medications, tests or procedures, care coordination (communications with other health care professionals or caregivers) and documentation in the medical record.   I,Param Shah,acting as a scribe for Sullivan Lone, MD. .I have reviewed the above documentation for accuracy and completeness, and I agree with the above. Brunetta Genera MD

## 2022-05-22 ENCOUNTER — Telehealth: Payer: Self-pay | Admitting: Hematology

## 2022-05-22 NOTE — Telephone Encounter (Signed)
Called patient per 2/21 los notes to schedule f/u. Left voicemail with new appointment information and contact details if needing to reschedule.

## 2022-05-30 ENCOUNTER — Other Ambulatory Visit: Payer: Self-pay

## 2022-05-30 DIAGNOSIS — I82442 Acute embolism and thrombosis of left tibial vein: Secondary | ICD-10-CM

## 2022-05-30 MED ORDER — ELIQUIS 5 MG PO TABS: 5.0000 mg | ORAL_TABLET | Freq: Two times a day (BID) | ORAL | 3 refills | Status: DC

## 2022-07-16 ENCOUNTER — Telehealth: Payer: Self-pay | Admitting: Hematology

## 2022-08-13 ENCOUNTER — Other Ambulatory Visit: Payer: Self-pay

## 2022-08-13 ENCOUNTER — Other Ambulatory Visit: Payer: Self-pay | Admitting: *Deleted

## 2022-08-13 ENCOUNTER — Inpatient Hospital Stay: Payer: BC Managed Care – PPO | Attending: Hematology

## 2022-08-13 DIAGNOSIS — Z862 Personal history of diseases of the blood and blood-forming organs and certain disorders involving the immune mechanism: Secondary | ICD-10-CM | POA: Diagnosis not present

## 2022-08-13 DIAGNOSIS — I82442 Acute embolism and thrombosis of left tibial vein: Secondary | ICD-10-CM

## 2022-08-13 DIAGNOSIS — D6859 Other primary thrombophilia: Secondary | ICD-10-CM

## 2022-08-13 DIAGNOSIS — Z7901 Long term (current) use of anticoagulants: Secondary | ICD-10-CM | POA: Insufficient documentation

## 2022-08-13 LAB — CMP (CANCER CENTER ONLY)
ALT: 12 U/L (ref 0–44)
AST: 17 U/L (ref 15–41)
Albumin: 4.3 g/dL (ref 3.5–5.0)
Alkaline Phosphatase: 58 U/L (ref 38–126)
Anion gap: 7 (ref 5–15)
BUN: 10 mg/dL (ref 6–20)
CO2: 26 mmol/L (ref 22–32)
Calcium: 9.3 mg/dL (ref 8.9–10.3)
Chloride: 108 mmol/L (ref 98–111)
Creatinine: 0.89 mg/dL (ref 0.44–1.00)
GFR, Estimated: >60 mL/min (ref >60)
Glucose, Bld: 107 mg/dL — ABNORMAL HIGH (ref 70–99)
Potassium: 3.9 mmol/L (ref 3.5–5.1)
Sodium: 141 mmol/L (ref 135–145)
Total Bilirubin: 0.4 mg/dL (ref 0.3–1.2)
Total Protein: 7.8 g/dL (ref 6.5–8.1)

## 2022-08-13 LAB — CBC WITH DIFFERENTIAL (CANCER CENTER ONLY)
Abs Immature Granulocytes: 0.01 10*3/uL (ref 0.00–0.07)
Basophils Absolute: 0.1 10*3/uL (ref 0.0–0.1)
Basophils Relative: 1 %
Eosinophils Absolute: 0.1 10*3/uL (ref 0.0–0.5)
Eosinophils Relative: 2 %
HCT: 41.6 % (ref 36.0–46.0)
Hemoglobin: 14 g/dL (ref 12.0–15.0)
Immature Granulocytes: 0 %
Lymphocytes Relative: 36 %
Lymphs Abs: 1.9 10*3/uL (ref 0.7–4.0)
MCH: 30.3 pg (ref 26.0–34.0)
MCHC: 33.7 g/dL (ref 30.0–36.0)
MCV: 90 fL (ref 80.0–100.0)
Monocytes Absolute: 0.4 10*3/uL (ref 0.1–1.0)
Monocytes Relative: 8 %
Neutro Abs: 2.9 10*3/uL (ref 1.7–7.7)
Neutrophils Relative %: 53 %
Platelet Count: 243 10*3/uL (ref 150–400)
RBC: 4.62 MIL/uL (ref 3.87–5.11)
RDW: 14.3 % (ref 11.5–15.5)
WBC Count: 5.4 10*3/uL (ref 4.0–10.5)
nRBC: 0 % (ref 0.0–0.2)

## 2022-08-13 LAB — D-DIMER, QUANTITATIVE: D-Dimer, Quant: <0.27 ug/mL-FEU (ref 0.00–0.50)

## 2022-08-14 LAB — BETA-2-GLYCOPROTEIN I ABS, IGG/M/A
Beta-2 Glyco I IgG: <9 GPI IgG units (ref 0–20)
Beta-2-Glycoprotein I IgA: <9 GPI IgA units (ref 0–25)
Beta-2-Glycoprotein I IgM: 18 GPI IgM units (ref 0–32)

## 2022-08-14 LAB — CARDIOLIPIN ANTIBODIES, IGG, IGM, IGA
Anticardiolipin IgA: <9 APL U/mL (ref 0–11)
Anticardiolipin IgG: <9 GPL U/mL (ref 0–14)
Anticardiolipin IgM: <9 MPL U/mL (ref 0–12)

## 2022-08-27 ENCOUNTER — Telehealth: Payer: BC Managed Care – PPO | Admitting: Hematology

## 2022-08-27 ENCOUNTER — Inpatient Hospital Stay (HOSPITAL_BASED_OUTPATIENT_CLINIC_OR_DEPARTMENT_OTHER): Payer: BC Managed Care – PPO | Admitting: Hematology

## 2022-08-27 DIAGNOSIS — Z862 Personal history of diseases of the blood and blood-forming organs and certain disorders involving the immune mechanism: Secondary | ICD-10-CM | POA: Diagnosis not present

## 2022-08-27 DIAGNOSIS — I82442 Acute embolism and thrombosis of left tibial vein: Secondary | ICD-10-CM

## 2022-08-27 DIAGNOSIS — Z7901 Long term (current) use of anticoagulants: Secondary | ICD-10-CM | POA: Diagnosis not present

## 2022-08-27 DIAGNOSIS — D6859 Other primary thrombophilia: Secondary | ICD-10-CM

## 2022-08-27 NOTE — Progress Notes (Signed)
HEMATOLOGY/ONCOLOGY CLINIC PHONE VISIT NOTE  Date of Service: 08/27/22   Patient Care Team: Nche, Bonna Gains, NP as PCP - General (Internal Medicine) Osborn Coho, MD as Consulting Physician (Obstetrics and Gynecology)  CHIEF COMPLAINTS/PURPOSE OF CONSULTATION:  DVT  HISTORY OF PRESENTING ILLNESS:   Sarah Hudson is a wonderful 57 y.o. female who has been referred to [place] by in patient ED physician, Dr. Alvester Chou, for evaluation and management of DVT of acute posterior vein thrombosis.   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 Patient has a PMHx significant for left lower extremity (left peroneal) DVT in feb 2016 which she reports occurred after a road trip to and from [place]. She was was placed on Xarelto and reports that she took her Xarelto for only about 71month and discontinued it and did not followup. She reports that she was having severe menorrhagia which is another reason she stopped it.  She subsequently after another road trip in Oct 2016 developed an acute LLE DVT and Rt sided PE's and was placed back on Xarelto which she has been taking since. She was admitted to the hospital in 07/2015 with symptomatic anemia with hgb of 6.7. She received 2 units of PRBC and IV iron. hgb was up to 10.1 with MCV of 73.9. Patient continues to be on oral iron. She notes that her periods continue to be heavy and wants to get off the Xarelto ASAP.  She had a rpt LLE [place] on 07/31/2015 which showed resolution of previous left peroneal DVT and no new DVT. She reports her father had a DVT but that it was in the setting of obesity, immobility and with no known thrombophilia.  In May of 2017, we recommended hypercoagulability testing. Patient decided she wanted to discontinue anticoagulation despite the results of hypercoag workup and therefore there was a decision not to proceed with hypercoag since it would not change  decision regarging anticoagulation. Patient was recommended to continue baby aspirin.   Patient is here today because she recently had blood clot last month, January 2024. Patient notes she had knee pain around the first week of January and that is where she had steroid injection. She reports no other changes other than the knee pain and steroid injection.   She presented at the ED on 04/19/2022 due to left calf pain and tightness in her lower leg. Her DVT ultrasound showed an acute posterior tibial vein thrombosis. Patient is unsure of what provoked this recent blood clot. She denies of long travel or any other life changes.   Patient reports she was not getting menstrual cycle for 8 months until recently that lasted 4 days.   She denies any new medical changes, new medicines, or any new life style changes. She does report she is currently trying to lose weight.  Patient is currently taking Eliquis 5 mg twice a day since last month. She notes she has not been taking aspirin since 2017.   She complains of consistent left knee pain, but denies left calf pain. Patient denies any injuries to the knee, but does think that she might have twisted her knee.   Patient denies fever, chills, night sweat, unexpected weight loss, abdominal pain, chest pain, or leg swelling.   INTERVAL HISTORY: Sarah Hudson is a wonderful 57 y.o. female who is contacted via phone for continued evaluation and management of DVT of acute posterior vein thrombosis. Patient was  last seen by me on 05/21/2022 and was doing well overall with no new medical concerns.   I connected with Sarah Hudson on 08/27/22 at  8:40 AM EDT by telephone visit and verified that I am speaking with the correct person using two identifiers.   I discussed the limitations, risks, security and privacy concerns of performing an evaluation and management service by telemedicine and the availability of in-person appointments. I also discussed with the  patient that there may be a patient responsible charge related to this service. The patient expressed understanding and agreed to proceed.   Other persons participating in the visit and their role in the encounter: none   Patient's location: home  Provider's [facility]   Chief Complaint: DVT    Today, she reports no new symptoms. The results of her recent lab workup was discussed with her in detail. Patient currently has no issues w menorrhagia and apears to be menopausal.   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    SURGICAL HISTORY: Past Surgical History:  Procedure Laterality Date   INCISION AND DRAINAGE Left 10/20/2017   Procedure: INCISION AND DRAINAGE LEFT LONG FINGER;  Surgeon: Betha Loa, MD;  Location: [facility];  Service: Orthopedics;  Laterality: Left;   TUBAL LIGATION     UMBILICAL HERNIA REPAIR      SOCIAL HISTORY: Social History   Socioeconomic History   Marital status: Married    Spouse name: Not on file   Number of children: 7   Years of education: 16   Highest education [place]: Not on file  Occupational History   Occupation: Airline pilot  Tobacco Use   Smoking status: Never   Smokeless tobacco: Never  Vaping Use   Vaping Use: Never used  Substance and Sexual Activity   Alcohol use: Not Currently    Comment: Occasional   Drug use: No   Sexual activity: Yes    Birth control/protection: Post-menopausal, Surgical    Comment: s/p tubal ligation  Other Topics Concern   Not on file  Social History Narrative   Fun: Travel   Denies religious beliefs effecting health care   Feels safe at home and denies abuse   Social Determinants of Health   Financial Resource Strain: Not on file  Food Insecurity: Not on file  Transportation Needs: Not on file  Physical Activity: Not on  file  Stress: Not on file  Social Connections: Not on file  Intimate Partner Violence: Not on file    FAMILY HISTORY: Family History  Problem Relation Age of Onset   Arthritis Mother    Hypertension Mother    Diabetes Mother    Hypertension Father    Colon polyps Father    Heart attack Maternal Grandfather        50's   Colon cancer Neg Hx    Esophageal cancer Neg Hx    Rectal cancer Neg Hx    Stomach cancer Neg Hx     ALLERGIES:  has No Known Allergies.  MEDICATIONS:  Current Outpatient Medications  Medication Sig Dispense Refill   ELDERBERRY PO Take 1 tablet by mouth 2 (two) times daily as needed (supplement).     ELIQUIS 5 MG TABS tablet Take 5 mg by mouth 2 (two) times  daily.     ibuprofen (ADVIL) 200 MG tablet Take 400 mg by mouth every 6 (six) hours as needed for moderate pain. (Patient not taking: Reported on 05/05/2022)     Multiple Vitamin (MULTIVITAMIN WITH MINERALS) TABS tablet Take 2 tablets by mouth daily.     RIVAROXABAN (XARELTO) VTE STARTER PACK (15 & 20 MG) Take 15 mg by mouth 2 (two) times daily. Follow package directions: Take one 15mg  tablet by mouth twice a day. On day 22, switch to one 20mg  tablet once a day. Take with food. (Patient not taking: Reported on 05/05/2022) 51 each 0   No current facility-administered medications for this visit.    REVIEW OF SYSTEMS:    10 Point review of Systems was done is negative except as noted above.   PHYSICAL EXAMINATION: PHONE VISIT  LABORATORY DATA:  I have reviewed the data as listed .    Latest Ref Rng & Units 08/13/2022   11:49 AM 05/05/2022   11:59 AM 04/19/2022    6:56 AM  CBC  WBC 4.0 - 10.5 K/uL 5.4  5.5  5.1   Hemoglobin 12.0 - 15.0 g/dL 16.1  09.6  04.5   Hematocrit 36.0 - 46.0 % 41.6  41.1  40.1   Platelets 150 - 400 K/uL 243  290  204    .    Latest Ref Rng & Units 08/13/2022   11:49 AM 05/05/2022   11:59 AM 04/19/2022    6:56 AM  CMP  Glucose 70 - 99 mg/dL 409  86  811   BUN 6 - 20 mg/dL 10  9   6    Creatinine 0.44 - 1.00 mg/dL 9.14  7.82  9.56   Sodium 135 - 145 mmol/L 141  141  140   Potassium 3.5 - 5.1 mmol/L 3.9  3.9  4.1   Chloride 98 - 111 mmol/L 108  108  107   CO2 22 - 32 mmol/L 26  28  24    Calcium 8.9 - 10.3 mg/dL 9.3  9.4  8.9   Total Protein 6.5 - 8.1 g/dL 7.8  7.6    Total Bilirubin 0.3 - 1.2 mg/dL 0.4  0.5    Alkaline Phos 38 - 126 U/L 58  60    AST 15 - 41 U/L 17  15    ALT 0 - 44 U/L 12  11       RADIOGRAPHIC STUDIES: I have personally reviewed the radiological images as listed and agreed with the findings in the report. No results found.  ASSESSMENT & PLAN:    57 yo female with    1) LLE DVT in 05/2014 provoked by an extensive car ride to [place] and back. Patient completed only 1 month of Xarelto and stop it due to menorhagia and decision not to followup for this   2) Acute LLE and Rt sided PE in 12/2014 - likely clot progression due to incompletely treated previous clot and repeat long distance car journey.   Rpt [place] LLE on 07/31/2015 shows no evidence of residual clot Unclear h/o VTE in father in the setting of other acquired risk factors. Patient is having severe menorrhagia and has required hospitalization for symptomatic anemia - leading to PRBC transfusion and IV iron in 07/2015. She has previous gone through 7 pregnancies without and VTE making an inherited thrombophilia less likely   3) Now with Acute left posterior tibial vein DVT -- no clear provoking event noted. Some immobility from long hours or  working at [employer].  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 CBC showed WBC of 5.4K, hemoglobin of 14.0, and platelets of 243K. -Repeat anti Cardiolipin antibodies and Beta-2 antibodies normalized -Beta-2 Glycoproteins normal -CBC and CMP normal -She not appear to have antiphospholipid antibody syndrome, but given her recurrent VTE syndrome, she was given the option of  continuing a preventative dose of Eliquis or switching to baby Aspirin.  -Patient would prefer to switch to baby Aspirin -Repeat d dimer test in 6-8 weeks -If D dimer starts elevateing, will switch to preventative dose of Eliquis for VTE prophylaxis -discussed interventions to try reduce VTE, including maintaining good hydration, utilizing compression socks, taking breaks to walk every hour for 5 minutes, and avoiding dehydration, etc -Repeat labs in 6 weeks  FOLLOW-UP: Labs in 6 weeks Phone visit with Dr Candise Che in 7 weeks  The total time spent in the appointment was 20 minutes* .  All of the patient's questions were answered with apparent satisfaction. The patient knows to call the clinic with any problems, questions or concerns.   Wyvonnia Lora MD MS AAHIVMS [facility] Hematology/Oncology Physician [facility]  .*Total Encounter Time as defined by the Centers for Medicare and Medicaid Services includes, in addition to the face-to-face time of a patient visit (documented in the note above) non-face-to-face time: obtaining and reviewing outside history, ordering and reviewing medications, tests or procedures, care coordination (communications with other health care professionals or caregivers) and documentation in the medical record.    I,Mitra Faeizi,acting as a Neurosurgeon for Wyvonnia Lora, MD.,have documented all relevant documentation on the behalf of Wyvonnia Lora, MD,as directed by  Wyvonnia Lora, MD while in the presence of Wyvonnia Lora, MD.  .Johney Maine MD

## 2022-08-28 ENCOUNTER — Telehealth: Payer: Self-pay | Admitting: Hematology

## 2022-10-06 ENCOUNTER — Other Ambulatory Visit: Payer: Self-pay

## 2022-10-06 DIAGNOSIS — D6859 Other primary thrombophilia: Secondary | ICD-10-CM

## 2022-10-07 ENCOUNTER — Inpatient Hospital Stay: Payer: BC Managed Care – PPO | Attending: Hematology

## 2022-10-14 ENCOUNTER — Telehealth: Payer: Self-pay | Admitting: Hematology

## 2022-10-14 ENCOUNTER — Inpatient Hospital Stay: Payer: BC Managed Care – PPO | Admitting: Hematology

## 2022-10-14 NOTE — Telephone Encounter (Signed)
Patient is aware of upcoming appointment times/dates.  

## 2022-10-14 NOTE — Telephone Encounter (Signed)
Called patient twice regarding rescheduled appointment, left call back incase appointment times/dates didn't work out

## 2022-12-08 ENCOUNTER — Inpatient Hospital Stay: Payer: BC Managed Care – PPO

## 2022-12-08 ENCOUNTER — Telehealth: Payer: Self-pay | Admitting: Hematology

## 2022-12-08 ENCOUNTER — Other Ambulatory Visit: Payer: BC Managed Care – PPO

## 2022-12-09 ENCOUNTER — Inpatient Hospital Stay: Payer: BC Managed Care – PPO | Attending: Hematology

## 2022-12-15 ENCOUNTER — Inpatient Hospital Stay: Payer: BC Managed Care – PPO | Admitting: Hematology

## 2022-12-21 NOTE — Progress Notes (Signed)
Patient did not show up for labs. This encounter was created in error - please disregard.

## 2023-07-10 ENCOUNTER — Other Ambulatory Visit: Payer: Self-pay

## 2023-07-10 ENCOUNTER — Emergency Department (HOSPITAL_BASED_OUTPATIENT_CLINIC_OR_DEPARTMENT_OTHER)
Admission: EM | Admit: 2023-07-10 | Discharge: 2023-07-10 | Disposition: A | Discharge status: Home / Self Care | Attending: Emergency Medicine | Admitting: Emergency Medicine

## 2023-07-10 ENCOUNTER — Emergency Department (HOSPITAL_BASED_OUTPATIENT_CLINIC_OR_DEPARTMENT_OTHER): Admitting: Radiology

## 2023-07-10 ENCOUNTER — Encounter (HOSPITAL_BASED_OUTPATIENT_CLINIC_OR_DEPARTMENT_OTHER): Payer: Self-pay | Admitting: Emergency Medicine

## 2023-07-10 DIAGNOSIS — I10 Essential (primary) hypertension: Secondary | ICD-10-CM | POA: Diagnosis not present

## 2023-07-10 DIAGNOSIS — R079 Chest pain, unspecified: Secondary | ICD-10-CM | POA: Diagnosis not present

## 2023-07-10 DIAGNOSIS — Z7901 Long term (current) use of anticoagulants: Secondary | ICD-10-CM | POA: Insufficient documentation

## 2023-07-10 DIAGNOSIS — R072 Precordial pain: Secondary | ICD-10-CM

## 2023-07-10 DIAGNOSIS — Z86718 Personal history of other venous thrombosis and embolism: Secondary | ICD-10-CM | POA: Insufficient documentation

## 2023-07-10 DIAGNOSIS — R519 Headache, unspecified: Secondary | ICD-10-CM

## 2023-07-10 LAB — CBC
HCT: 41.4 % (ref 36.0–46.0)
Hemoglobin: 13.7 g/dL (ref 12.0–15.0)
MCH: 30.1 pg (ref 26.0–34.0)
MCHC: 33.1 g/dL (ref 30.0–36.0)
MCV: 91 fL (ref 80.0–100.0)
Platelets: 232 10*3/uL (ref 150–400)
RBC: 4.55 MIL/uL (ref 3.87–5.11)
RDW: 14.2 % (ref 11.5–15.5)
WBC: 5.9 10*3/uL (ref 4.0–10.5)
nRBC: 0 % (ref 0.0–0.2)

## 2023-07-10 LAB — BASIC METABOLIC PANEL WITH GFR
Anion gap: 8 (ref 5–15)
BUN: 11 mg/dL (ref 6–20)
CO2: 27 mmol/L (ref 22–32)
Calcium: 9.3 mg/dL (ref 8.9–10.3)
Chloride: 106 mmol/L (ref 98–111)
Creatinine, Ser: 0.84 mg/dL (ref 0.44–1.00)
GFR, Estimated: >60 mL/min (ref >60)
Glucose, Bld: 83 mg/dL (ref 70–99)
Potassium: 3.5 mmol/L (ref 3.5–5.1)
Sodium: 141 mmol/L (ref 135–145)

## 2023-07-10 LAB — TROPONIN I (HIGH SENSITIVITY)
Troponin I (High Sensitivity): 3 ng/L (ref <18)
Troponin I (High Sensitivity): 4 ng/L (ref <18)

## 2023-07-10 MED ORDER — HYDROCHLOROTHIAZIDE 12.5 MG PO TABS
12.5000 mg | ORAL_TABLET | Freq: Every day | ORAL | 0 refills | Status: DC
Start: 2023-07-10 — End: 2023-07-29

## 2023-07-10 NOTE — ED Provider Notes (Signed)
 [place] EMERGENCY DEPARTMENT AT [facility] Provider Note   CSN: [id number] Arrival date & time: 07/10/23  1356     History  Chief Complaint  Patient presents with   Headache   Hypertension    Sarah Hudson is a 58 y.o. female.  Patient with history of DVT, treated with anticoagulation, now off of anticoagulation --presents to the emergency department today for evaluation of elevated blood pressure readings as well as headache and some chest discomfort.  Patient has a family member at home currently recovering from renal transplant.  Patient has had a mild "nagging" generalized headache and took her blood pressure.  She reports that yesterday it was elevated in the 180-200 systolic range.  She checked her blood pressure on 2 different cuffs and it was elevated.  Today she had some chest discomfort.  It started just prior to arrival.  Pain was not exertional.  No associated vomiting or diaphoresis.  No shortness of breath.  She feels generally unwell.  Denies GI symptoms, urinary symptoms.  No lower extremity edema.  No history of hypertension, diabetes, high cholesterol.       Home Medications Prior to Admission medications   Medication Sig Start Date End Date Taking? Authorizing Provider  ELDERBERRY PO Take 1 tablet by mouth 2 (two) times daily as needed (supplement).    [provider]  ELIQUIS 5 MG TABS tablet Take 1 tablet (5 mg total) by mouth 2 (two) times daily. 05/30/22   Kale, Gautam Kishore, MD  ibuprofen (ADVIL) 200 MG tablet Take 400 mg by mouth every 6 (six) hours as needed for moderate pain. Patient not taking: Reported on 05/05/2022    [provider]  Multiple Vitamin (MULTIVITAMIN WITH MINERALS) TABS tablet Take 2 tablets by mouth daily.    [provider]      Allergies    Patient has no known allergies.    Review of Systems   Review of Systems  Physical Exam Updated Vital Signs BP (!) 158/81   Pulse 65   Temp 98 F  (36.7 C)   Resp 18   SpO2 96%  Physical Exam Vitals and nursing note reviewed.  Constitutional:      Appearance: She is well-developed. She is not diaphoretic.  HENT:     Head: Normocephalic and atraumatic.     Mouth/Throat:     Mouth: Mucous membranes are not dry.  Eyes:     Conjunctiva/sclera: Conjunctivae normal.  Neck:     Vascular: Normal carotid pulses. No JVD.     Trachea: Trachea normal. No tracheal deviation.  Cardiovascular:     Rate and Rhythm: Normal rate and regular rhythm.     Pulses: No decreased pulses.          Radial pulses are 2+ on the right side and 2+ on the left side.     Heart sounds: Normal heart sounds, S1 normal and S2 normal. No murmur heard.    Comments: No carotid bruit Pulmonary:     Effort: Pulmonary effort is normal. No respiratory distress.     Breath sounds: No wheezing.  Chest:     Chest wall: No tenderness.  Abdominal:     General: Bowel sounds are normal.     Palpations: Abdomen is soft.     Tenderness: There is no abdominal tenderness. There is no guarding or rebound.  Musculoskeletal:        General: Normal range of motion.     Cervical back:  Normal range of motion and neck supple. No muscular tenderness.  Skin:    General: Skin is warm and dry.     Coloration: Skin is not pale.  Neurological:     Mental Status: She is alert.     ED Results / Procedures / Treatments   Labs (all labs ordered are listed, but only abnormal results are displayed) Labs Reviewed  BASIC METABOLIC PANEL WITH GFR  CBC  PREGNANCY, URINE  TROPONIN I (HIGH SENSITIVITY)  TROPONIN I (HIGH SENSITIVITY)    ED ECG REPORT   Date: 07/10/2023  Rate: 73  Rhythm: normal sinus rhythm  QRS Axis: normal  Intervals: normal  ST/T Wave abnormalities: normal  Conduction Disutrbances:none  Narrative Interpretation:   Old EKG Reviewed: unchanged  I have personally reviewed the EKG tracing and agree with the computerized printout as noted.   Radiology DG  Chest 2 View Result Date: 07/10/2023 CLINICAL DATA:  Chest pain EXAM: CHEST - 2 VIEW COMPARISON:  Chest x-ray 09/15/2019 FINDINGS: No consolidation, pneumothorax or effusion. No edema. Normal cardiopericardial silhouette. Underinflation. Air-fluid Bleiler along the stomach beneath the left hemidiaphragm. IMPRESSION: No acute cardiopulmonary disease. Electronically Signed   By: Adrianna Horde M.D.   On: 07/10/2023 15:58    Procedures Procedures    Medications Ordered in ED Medications - No data to display  ED Course/ Medical Decision Making/ A&P    Patient seen and examined. History obtained directly from patient.   Labs/EKG: Labs ordered in triage personally reviewed and interpreted including: CBC unremarkable; BMP unremarkable; troponin normal at 3.  Awaiting second troponin.  EKG normal rate and regular rhythm.  Unchanged from previous.  Imaging: Personally reviewed and interpreted including chest x-ray, agree negative.  Medications/Fluids: None ordered  Most recent vital signs reviewed and are as follows: BP (!) 158/81   Pulse 65   Temp 98 F (36.7 C)   Resp 18   SpO2 96%   Initial impression: Hypertension, headache without red flags other than age greater than 50 but unchanged from previous headaches, nonspecific chest pain.  7:02 PM Reassessment performed. Patient appears comfortable.  Reports mild headache, not severe.  She states that she is hungry and wants to get something to eat.  Labs personally reviewed and interpreted including: Second troponin nml: 3>>4.   Reviewed pertinent lab work and imaging with patient at bedside. Questions answered.   Most current vital signs reviewed and are as follows: BP (!) 151/74   Pulse 65   Temp 98.2 F (36.8 C)   Resp 18   SpO2 96%   Plan: Discharge to home  Prescriptions written for: hydrochlorothiazide 12.5mg  PO  Other home care instructions discussed: Take blood pressure medication if systolic blood pressure greater than  150 or diastolic pressure greater than 100.  Otherwise check again the next day.    ED return instructions discussed: Return and follow-up instructions: I encouraged patient to return to ED with severe chest pain, especially if the pain is crushing or pressure-like and spreads to the arms, back, neck, or jaw, or if they have associated sweating, vomiting, or shortness of breath with the pain, or significant pain with activity.   Follow-up instructions discussed: Patient encouraged to follow-up with their PCP in 5 days.                                 Medical Decision Making Amount and/or Complexity of Data Reviewed Labs: ordered.  Radiology: ordered.  Risk Prescription drug management.   HTN: Noted incidentally.  Unclear if this represents a long-term elevation of blood pressure or just related to recent stressors.  Her blood pressures have been running quite high on 2 cuffs at home, greater than 180 at times.  Patient is given low-dose BP meds to take if blood pressure is higher, otherwise she will skip dose and follow-up with PCP when able.  At this point, no concerns for endorgan damage.  HA: Patient does report mild headache.  No thunderclap onset.  No focal neurodeficits.  She was on anticoagulation, but not recently.  No concern for intracranial bleeding.  No decompensation during ED stay.  Do not feel that she requires advanced imaging at this time.  She looks very well, comfortable.  No meningismus.  CP: For this patient's complaint of chest pain, the following emergent conditions were considered on the differential diagnosis: acute coronary syndrome, pulmonary embolism, pneumothorax, myocarditis, pericardial tamponade, aortic dissection, thoracic aortic aneurysm complication, esophageal perforation.   Other causes were also considered including: gastroesophageal reflux disease, musculoskeletal pain including costochondritis, pneumonia/pleurisy, herpes zoster, pericarditis.  In  regards to possibility of ACS, patient has atypical features of pain, non-ischemic and unchanged EKG and negative troponin(s). Heart score was calculated to be 1-2.   In regards to possibility of PE, symptoms are atypical for PE and risk profile is low, making PE low likelihood.   The patient's vital signs, pertinent lab work and imaging were reviewed and interpreted as discussed in the ED course. Hospitalization was considered for further testing, treatments, or serial exams/observation. However as patient is well-appearing, has a stable exam, and reassuring studies today, I do not feel that they warrant admission at this time. This plan was discussed with the patient who verbalizes agreement and comfort with this plan and seems reliable and able to return to the Emergency Department with worsening or changing symptoms.           Final Clinical Impression(s) / ED Diagnoses Final diagnoses:  Precordial pain  Acute nonintractable headache, unspecified headache type  Uncontrolled hypertension    Rx / DC Orders ED Discharge Orders          Ordered    hydrochlorothiazide (HYDRODIURIL) 12.5 MG tablet  Daily        07/10/23 1850              Lyna Sandhoff, PA-C 07/10/23 1906    Mozell Arias, MD 07/11/23 1443

## 2023-07-10 NOTE — Discharge Instructions (Addendum)
 Please read and follow all provided instructions.  Your diagnoses today include:  1. Elevated blood pressure reading   2. Precordial pain   3. Acute nonintractable headache, unspecified headache type     Tests performed today include: An EKG of your heart A chest x-ray Cardiac enzymes - a blood test for heart muscle damage Blood counts and electrolytes Vital signs. See below for your results today.   Medications prescribed:  Hydrochlorothiazide: medication for high blood pressure  Please take if your blood pressure is over 150 systolic (top number) or above 100 diastolic (bottom number). Otherwise do not take and check your blood pressure the next day.   Take any prescribed medications only as directed.  Follow-up instructions: Please follow-up with your primary care provider as soon as you can for further evaluation of your symptoms.   Return instructions:  SEEK IMMEDIATE MEDICAL ATTENTION IF: You have severe chest pain, especially if the pain is crushing or pressure-like and spreads to the arms, back, neck, or jaw, or if you have sweating, nausea or vomiting, or trouble with breathing. THIS IS AN EMERGENCY. Do not wait to see if the pain will go away. Get medical help at once. Call 911. DO NOT drive yourself to the hospital.  Your chest pain gets worse and does not go away after a few minutes of rest.  You have an attack of chest pain lasting longer than what you usually experience.  You have significant dizziness, if you pass out, or have trouble walking.  You have chest pain not typical of your usual pain for which you originally saw your caregiver.  You have any other emergent concerns regarding your health.  Additional Information: Chest pain comes from many different causes. Your caregiver has diagnosed you as having chest pain that is not specific for one problem, but does not require admission.  You are at low risk for an acute heart condition or other serious illness.    Your vital signs today were: BP (!) 158/81   Pulse 65   Temp 98 F (36.7 C)   Resp 18   SpO2 96%  If your blood pressure (BP) was elevated above 135/85 this visit, please have this repeated by your doctor within one month. --------------  Thank you for the opportunity to take care of you in our Emergency Department. You have been diagnosed with high blood pressure, also known as hypertension. This means that the force of blood against the walls of your blood vessels called is too strong. It also means that your heart has to work harder to move the blood. High blood pressure usually has no symptoms, but over time, it can cause serious health problems such as Heart attack and heart failure Stroke Kidney disease and failure Vision loss With the help from your healthcare provider and some important life style changes, you can manage your blood pressure and protect your health. Please read the instructions provided on hypertension, how to manage it and how to check your blood pressure. Additionally, use the blood pressure log provided to record your blood pressures. Take the blood pressure log with you to your primary care doctor so that they can adjust your blood pressure medications if needed. Please read the instructions on follow-up appointment. Return to the ER or Call 911 right away if you have any of these symptoms: Chest pain or shortness of breath Severe headache Weakness, tingling, or numbness of your face, arms, or legs (especially on 1 side of the body)  Sudden change in vision Confusion, trouble speaking, or trouble understanding speech

## 2023-07-10 NOTE — ED Notes (Signed)
Reviewed discharge instructions, medications, and home care with pt. Pt verbalized understanding and had no further questions. Pt exited ED without complications.

## 2023-07-10 NOTE — ED Triage Notes (Signed)
 High BP 200/90s at home yesterdau Headache and " not feeling well"

## 2023-07-14 ENCOUNTER — Telehealth: Payer: Self-pay

## 2023-07-14 NOTE — Transitions of Care (Post Inpatient/ED Visit) (Unsigned)
   07/14/2023  Name: Sarah Hudson MRN: [id number] DOB: May 12, 1965  Today's TOC FU Call Status: Today's TOC FU Call Status:: Unsuccessful Call (1st Attempt) Unsuccessful Call (1st Attempt) Date: 07/14/23  Attempted to reach the patient regarding the most recent Inpatient/ED visit.  Follow Up Plan: Additional outreach attempts will be made to reach the patient to complete the Transitions of Care (Post Inpatient/ED visit) call.   Signature Alphonsa Jasper, BSN, Charity fundraiser

## 2023-07-14 NOTE — Telephone Encounter (Signed)
 Noted.

## 2023-07-15 NOTE — Transitions of Care (Post Inpatient/ED Visit) (Signed)
   07/15/2023  Name: Sarah Hudson MRN: [id number] DOB: 12/30/1965  Today's TOC FU Call Status: Today's TOC FU Call Status:: Successful TOC FU Call Completed Unsuccessful Call (1st Attempt) Date: 07/14/23 [facility] FU Call Complete Date: 07/15/23 Patient's Name and Date of Birth confirmed.  Transition Care Management Follow-up Telephone Call Date of Discharge: 07/10/23 Discharge Facility: [facility] ([facility]) Type of Discharge: Emergency Department Reason for ED Visit: Other: (HTN and headache) How have you been since you were released from the hospital?: Better Any questions or concerns?: No  Items Reviewed: Did you receive and understand the discharge instructions provided?: Yes Medications obtained,verified, and reconciled?: Yes (Medications Reviewed) Any new allergies since your discharge?: No Dietary orders reviewed?: NA Do you have support at home?: No  Medications Reviewed Today: Medications Reviewed Today     Reviewed by Lonie Roa, CMA (Certified Medical Assistant) on 07/15/23 at [phone]  Med List Status: <None>   Medication Order Taking? Sig Documenting Provider Last Dose Status Informant  ELDERBERRY PO [id number] Yes Take 1 tablet by mouth 2 (two) times daily as needed (supplement). [provider] Taking Active Self  hydrochlorothiazide (HYDRODIURIL) 12.5 MG tablet [id number] Yes Take 1 tablet (12.5 mg total) by mouth daily. Lyna Sandhoff, PA-C Taking Active   HYDROcodone-acetaminophen (NORCO/VICODIN) 5-325 MG tablet [id number] Yes Take by mouth. [provider] Taking Active   ibuprofen (ADVIL) 200 MG tablet [id number] No Take 400 mg by mouth every 6 (six) hours as needed for moderate pain.  Patient not taking: Reported on 05/05/2022   [provider] Not Taking Active Self  Multiple Vitamin (MULTIVITAMIN WITH MINERALS) TABS tablet [id number] Yes Take 2 tablets by mouth daily. [provider] Taking Active Self   sulfamethoxazole-trimethoprim (BACTRIM DS) 800-160 MG tablet [id number] Yes Take by mouth. [provider] Taking Active             Home Care and Equipment/Supplies: Were Home Health Services Ordered?: NA Any new equipment or medical supplies ordered?: NA  Functional Questionnaire: Do you need assistance with bathing/showering or dressing?: No Do you need assistance with meal preparation?: No Do you need assistance with eating?: No Do you have difficulty maintaining continence: No Do you need assistance with getting out of bed/getting out of a chair/moving?: No Do you have difficulty managing or taking your medications?: No  Follow up appointments reviewed: PCP Follow-up appointment confirmed?: Yes Date of PCP follow-up appointment?: 07/29/23 Follow-up Provider: Kathrene Parents NP [facility] Follow-up appointment confirmed?: NA Do you need transportation to your follow-up appointment?: No Do you understand care options if your condition(s) worsen?: Yes-patient verbalized understanding    SIGNATURE Kirby Peoples, RMA

## 2023-07-29 ENCOUNTER — Encounter: Payer: Self-pay | Admitting: Nurse Practitioner

## 2023-07-29 ENCOUNTER — Ambulatory Visit (INDEPENDENT_AMBULATORY_CARE_PROVIDER_SITE_OTHER): Admitting: Nurse Practitioner

## 2023-07-29 VITALS — BP 150/80 | HR 74 | Temp 98.0°F | Ht 65.0 in | Wt 234.6 lb

## 2023-07-29 DIAGNOSIS — I10 Essential (primary) hypertension: Secondary | ICD-10-CM | POA: Insufficient documentation

## 2023-07-29 HISTORY — DX: Essential (primary) hypertension: I10

## 2023-07-29 MED ORDER — HYDROCHLOROTHIAZIDE 12.5 MG PO TABS
12.5000 mg | ORAL_TABLET | Freq: Every day | ORAL | 5 refills | Status: AC
Start: 2023-07-29 — End: ?

## 2023-07-29 NOTE — Patient Instructions (Signed)
 Maintain Heart healthy diet and daily exercise. Maintain current medications. Home BP at home Daily in AM Bring BP readings and BP machine to next appointment  DASH Eating Plan DASH stands for Dietary Approaches to Stop Hypertension. The DASH eating plan is a healthy eating plan that has been shown to: Lower high blood pressure (hypertension). Reduce your risk for type 2 diabetes, heart disease, and stroke. Help with weight loss. What are tips for following this plan? Reading food labels Check food labels for the amount of salt (sodium) per serving. Choose foods with less than 5 percent of the Daily Value (DV) of sodium. In general, foods with less than 300 milligrams (mg) of sodium per serving fit into this eating plan. To find whole grains, look for the word "whole" as the first word in the ingredient list. Shopping Buy products labeled as "low-sodium" or "no salt added." Buy fresh foods. Avoid canned foods and pre-made or frozen meals. Cooking Try not to add salt when you cook. Use salt-free seasonings or herbs instead of table salt or sea salt. Check with your health care provider or pharmacist before using salt substitutes. Do not fry foods. Cook foods in healthy ways, such as baking, boiling, grilling, roasting, or broiling. Cook using oils that are good for your heart. These include olive, canola, avocado, soybean, and sunflower oil. Meal planning  Eat a balanced diet. This should include: 4 or more servings of fruits and 4 or more servings of vegetables each day. Try to fill half of your plate with fruits and vegetables. 6-8 servings of whole grains each day. 6 or less servings of lean meat, poultry, or fish each day. 1 oz is 1 serving. A 3 oz (85 g) serving of meat is about the same size as the palm of your hand. One egg is 1 oz (28 g). 2-3 servings of low-fat dairy each day. One serving is 1 cup (237 mL). 1 serving of nuts, seeds, or beans 5 times each week. 2-3 servings of  heart-healthy fats. Healthy fats called omega-3 fatty acids are found in foods such as walnuts, flaxseeds, fortified milks, and eggs. These fats are also found in cold-water fish, such as sardines, salmon, and mackerel. Limit how much you eat of: Canned or prepackaged foods. Food that is high in trans fat, such as fried foods. Food that is high in saturated fat, such as fatty meat. Desserts and other sweets, sugary drinks, and other foods with added sugar. Full-fat dairy products. Do not salt foods before eating. Do not eat more than 4 egg yolks a week. Try to eat at least 2 vegetarian meals a week. Eat more home-cooked food and less restaurant, buffet, and fast food. Lifestyle When eating at a restaurant, ask if your food can be made with less salt or no salt. If you drink alcohol: Limit how much you have to: 0-1 drink a day if you are female. 0-2 drinks a day if you are female. Know how much alcohol is in your drink. In the [place] one drink is one 12 oz bottle of beer (355 mL), one 5 oz glass of wine (148 mL), or one 1 oz glass of hard liquor (44 mL). General information Avoid eating more than 2,300 mg of salt a day. If you have hypertension, you may need to reduce your sodium intake to 1,500 mg a day. Work with your provider to stay at a healthy body weight or lose weight. Ask what the best weight range is for  you. On most days of the week, get at least 30 minutes of exercise that causes your heart to beat faster. This may include walking, swimming, or biking. Work with your provider or dietitian to adjust your eating plan to meet your specific calorie needs. What foods should I eat? Fruits All fresh, dried, or frozen fruit. Canned fruits that are in their natural juice and do not have sugar added to them. Vegetables Fresh or frozen vegetables that are raw, steamed, roasted, or grilled. Low-sodium or reduced-sodium tomato and vegetable juice. Low-sodium or reduced-sodium tomato sauce and  tomato paste. Low-sodium or reduced-sodium canned vegetables. Grains Whole-grain or whole-wheat bread. Whole-grain or whole-wheat pasta. Brown rice. Sarah Hudson. Bulgur. Whole-grain and low-sodium cereals. Pita bread. Low-fat, low-sodium crackers. Whole-wheat flour tortillas. Meats and other proteins Skinless chicken or [place]. Ground chicken or [place]. Pork with fat trimmed off. Fish and seafood. Egg whites. Dried beans, peas, or lentils. Unsalted nuts, nut butters, and seeds. Unsalted canned beans. Lean cuts of beef with fat trimmed off. Low-sodium, lean precooked or cured meat, such as sausages or meat loaves. Dairy Low-fat (1%) or fat-free (skim) milk. Reduced-fat, low-fat, or fat-free cheeses. Nonfat, low-sodium ricotta or cottage cheese. Low-fat or nonfat yogurt. Low-fat, low-sodium cheese. Fats and oils Soft margarine without trans fats. Vegetable oil. Reduced-fat, low-fat, or light mayonnaise and salad dressings (reduced-sodium). Canola, safflower, olive, avocado, soybean, and sunflower oils. Avocado. Seasonings and condiments Herbs. Spices. Seasoning mixes without salt. Other foods Unsalted popcorn and pretzels. Fat-free sweets. The items listed above may not be all the foods and drinks you can have. Talk to a dietitian to learn more. What foods should I avoid? Fruits Canned fruit in a light or heavy syrup. Fried fruit. Fruit in cream or butter sauce. Vegetables Creamed or fried vegetables. Vegetables in a cheese sauce. Regular canned vegetables that are not marked as low-sodium or reduced-sodium. Regular canned tomato sauce and paste that are not marked as low-sodium or reduced-sodium. Regular tomato and vegetable juices that are not marked as low-sodium or reduced-sodium. Vanessa General. Olives. Grains Baked goods made with fat, such as croissants, muffins, or some breads. Dry pasta or rice meal packs. Meats and other proteins Fatty cuts of meat. Ribs. Fried meat. Helene Loader. Bologna,  salami, and other precooked or cured meats, such as sausages or meat loaves, that are not lean and low in sodium. Fat from the back of a pig (fatback). Bratwurst. Salted nuts and seeds. Canned beans with added salt. Canned or smoked fish. Whole eggs or egg yolks. Chicken or [place] with skin. Dairy Whole or 2% milk, cream, and half-and-half. Whole or full-fat cream cheese. Whole-fat or sweetened yogurt. Full-fat cheese. Nondairy creamers. Whipped toppings. Processed cheese and cheese spreads. Fats and oils Butter. Stick margarine. Lard. Shortening. Ghee. Bacon fat. Tropical oils, such as coconut, palm kernel, or palm oil. Seasonings and condiments Onion salt, garlic salt, seasoned salt, table salt, and sea salt. Worcestershire sauce. Tartar sauce. Barbecue sauce. Teriyaki sauce. Soy sauce, including reduced-sodium soy sauce. Steak sauce. Canned and packaged gravies. Fish sauce. Oyster sauce. Cocktail sauce. Store-bought horseradish. Ketchup. Mustard. Meat flavorings and tenderizers. Bouillon cubes. Hot sauces. Pre-made or packaged marinades. Pre-made or packaged taco seasonings. Relishes. Regular salad dressings. Other foods Salted popcorn and pretzels. The items listed above may not be all the foods and drinks you should avoid. Talk to a dietitian to learn more. Where to find more information National Heart, Lung, and Blood Institute (NHLBI): BuffaloDryCleaner.gl American Heart Association (AHA): heart.org Academy of  Nutrition and Dietetics: eatright.org National Kidney Foundation (NKF): kidney.org This information is not intended to replace advice given to you by your health care provider. Make sure you discuss any questions you have with your health care provider. Document Revised: 04/03/2022 Document Reviewed: 04/03/2022 Elsevier Patient Education  2024 [employer].

## 2023-07-29 NOTE — Progress Notes (Signed)
 Established Patient Visit  Patient: Sarah Hudson   DOB: Apr 02, 1965   58 y.o. Female  MRN: [id number] Visit Date: 07/29/2023  Subjective:    Chief Complaint  Patient presents with   Follow-up    Recent ED visit for elevated HTN started on medication    HPI HTN (hypertension), benign New diagnosis Noted elevated BP on 07/07/23 and 07/10/23. This led to ED visit. BP prior to ED visit: 189/?, 200/90 associated with headache. Home BP since ED visit:142/70, 145/72, 142/77, 140/72, 145/84, 148/82. Denies any apneic episodes or snoring. She took prescribed hydrochlorothiazide  4x since ED visit. She states she has implement diet modifications-low sodium S/p tubal ligation, perimenopausal with irregular cycle No tobacco or alcohol or illicit drug use Fhx of HYPERTENSION-mother and father. Normal CBC, BMP, ECG and troponin during recent ED visit BP Readings from Last 3 Encounters:  07/29/23 (!) 148/84  07/10/23 (!) 151/74  05/05/22 134/66   Improving BP but not at goal. Advised to take hydrochlorothiazide  daily and maintain DASH diet. Monitor BP at home daily, Bring BP readings and machine to next appointment. F/up in 65month   Reviewed medical, surgical, and social history today  Medications: Outpatient Medications Prior to Visit  Medication Sig   ELDERBERRY PO Take 1 tablet by mouth 2 (two) times daily as needed (supplement).   ibuprofen (ADVIL) 200 MG tablet Take 400 mg by mouth every 6 (six) hours as needed for moderate pain (pain score 4-6).   Multiple Vitamin (MULTIVITAMIN WITH MINERALS) TABS tablet Take 2 tablets by mouth daily.   [DISCONTINUED] hydrochlorothiazide  (HYDRODIURIL ) 12.5 MG tablet Take 1 tablet (12.5 mg total) by mouth daily.   HYDROcodone -acetaminophen  (NORCO/VICODIN) 5-325 MG tablet Take by mouth. (Patient not taking: Reported on 07/29/2023)   [DISCONTINUED] sulfamethoxazole -trimethoprim  (BACTRIM  DS) 800-160 MG tablet Take by mouth. (Patient not  taking: Reported on 07/29/2023)   No facility-administered medications prior to visit.   Reviewed past medical and social history.   ROS per HPI above  Last CBC Lab Results  Component Value Date   WBC 5.9 07/10/2023   HGB 13.7 07/10/2023   HCT 41.4 07/10/2023   MCV 91.0 07/10/2023   MCH 30.1 07/10/2023   RDW 14.2 07/10/2023   PLT 232 07/10/2023   Last metabolic panel Lab Results  Component Value Date   GLUCOSE 83 07/10/2023   NA 141 07/10/2023   K 3.5 07/10/2023   CL 106 07/10/2023   CO2 27 07/10/2023   BUN 11 07/10/2023   CREATININE 0.84 07/10/2023   GFRNONAA >60 07/10/2023   CALCIUM 9.3 07/10/2023   PROT 7.8 08/13/2022   ALBUMIN 4.3 08/13/2022   BILITOT 0.4 08/13/2022   ALKPHOS 58 08/13/2022   AST 17 08/13/2022   ALT 12 08/13/2022   ANIONGAP 8 07/10/2023        Objective:  BP (!) 150/80 (BP Location: Left Arm, Patient Position: Supine, Cuff Size: Normal)   Pulse 74   Temp 98 F (36.7 C) (Temporal)   Ht 5\' 5"  (1.651 m)   Wt 234 lb 9.6 oz (106.4 kg)   LMP 07/22/2023 (Exact Date)   SpO2 98%   BMI 39.04 kg/m      Physical Exam Vitals and nursing note reviewed.  Constitutional:      Appearance: She is obese.  Neck:     Thyroid : No thyroid  mass, thyromegaly or thyroid  tenderness.  Cardiovascular:     Rate and Rhythm:  Normal rate and regular rhythm.     Pulses: Normal pulses.     Heart sounds: Normal heart sounds.  Pulmonary:     Effort: Pulmonary effort is normal.     Breath sounds: Normal breath sounds.  Musculoskeletal:     Right lower leg: No edema.     Left lower leg: No edema.  Neurological:     Mental Status: She is alert and oriented to person, place, and time.     No results found for any visits on 07/29/23.    Assessment & Plan:    Problem List Items Addressed This Visit     HTN (hypertension), benign - Primary   New diagnosis Noted elevated BP on 07/07/23 and 07/10/23. This led to ED visit. BP prior to ED visit: 189/?, 200/90  associated with headache. Home BP since ED visit:142/70, 145/72, 142/77, 140/72, 145/84, 148/82. Denies any apneic episodes or snoring. She took prescribed hydrochlorothiazide  4x since ED visit. She states she has implement diet modifications-low sodium S/p tubal ligation, perimenopausal with irregular cycle No tobacco or alcohol or illicit drug use Fhx of HYPERTENSION-mother and father. Normal CBC, BMP, ECG and troponin during recent ED visit BP Readings from Last 3 Encounters:  07/29/23 (!) 148/84  07/10/23 (!) 151/74  05/05/22 134/66   Improving BP but not at goal. Advised to take hydrochlorothiazide  daily and maintain DASH diet. Monitor BP at home daily, Bring BP readings and machine to next appointment. F/up in 58month       Relevant Medications   hydrochlorothiazide  (HYDRODIURIL ) 12.5 MG tablet   Return in about 4 weeks (around 08/26/2023) for HTN (fatsing).     Kathrene Parents, NP

## 2023-07-29 NOTE — Assessment & Plan Note (Addendum)
 New diagnosis Noted elevated BP on 07/07/23 and 07/10/23. This led to ED visit. BP prior to ED visit: 189/?, 200/90 associated with headache. Home BP since ED visit:142/70, 145/72, 142/77, 140/72, 145/84, 148/82. Denies any apneic episodes or snoring. She took prescribed hydrochlorothiazide  4x since ED visit. She states she has implement diet modifications-low sodium S/p tubal ligation, perimenopausal with irregular cycle No tobacco or alcohol or illicit drug use Fhx of HYPERTENSION-mother and father. Normal CBC, BMP, ECG and troponin during recent ED visit BP Readings from Last 3 Encounters:  07/29/23 (!) 148/84  07/10/23 (!) 151/74  05/05/22 134/66   Improving BP but not at goal. Advised to take hydrochlorothiazide  daily and maintain DASH diet. Monitor BP at home daily, Bring BP readings and machine to next appointment. F/up in 43month

## 2023-08-28 ENCOUNTER — Ambulatory Visit: Admitting: Nurse Practitioner

## 2023-10-10 ENCOUNTER — Ambulatory Visit: Payer: Self-pay

## 2023-10-10 ENCOUNTER — Other Ambulatory Visit: Payer: Self-pay

## 2023-10-10 ENCOUNTER — Ambulatory Visit
Admission: RE | Admit: 2023-10-10 | Discharge: 2023-10-10 | Disposition: A | Discharge status: Home / Self Care | Source: Ambulatory Visit | Attending: Physician Assistant | Admitting: Physician Assistant

## 2023-10-10 VITALS — BP 128/86 | HR 75 | Temp 98.1°F | Resp 18 | Ht 64.5 in | Wt 230.0 lb

## 2023-10-10 DIAGNOSIS — R5383 Other fatigue: Secondary | ICD-10-CM | POA: Diagnosis not present

## 2023-10-10 NOTE — Discharge Instructions (Signed)
 You were seen today for concerns of fatigue. I suspect that this likely may be secondary to your recent illness that you are still recovering from but we have drawn lab work to check for anemia, dehydration or electrolyte abnormalities, B12 and vitamin D  as well as your iron .  We will keep you updated on these results as well as any recommendations once they return.  If everything is normal but you are still feeling fatigued you should likely follow-up with your primary care provider for further evaluation and ongoing management.  Please make sure that you are staying well-hydrated and eating regularly throughout the day.  Follow-up as needed if you feel your symptoms are persisting or not improving over the next 1 to 2 weeks

## 2023-10-10 NOTE — ED Triage Notes (Signed)
 Pt presents with a chief complaint of fatigue x 8 days. Pt states she feels like she can sleep all day long. Requesting blood draw today to check for deficiencies.

## 2023-10-10 NOTE — ED Provider Notes (Signed)
 [facility]    CSN: [id number] Arrival date & time: 10/10/23  1349      History   Chief Complaint Chief Complaint  Patient presents with   Fatigue    HPI Sarah Hudson is a 58 y.o. female.   HPI  Pt reports that she is having severe fatigue She states she was sick around 10/02/23 and had fatigue with this but that seemed to resolve except for the tiredness  She states that several other family members were sick with similar symptoms around that time   She is concerned for a potential deficiency and would like blood work  She tried drinking an energy drink yesterday and this seemed to give her palpitations but that has resolved  She drank another one today and has not had any symptoms   She reports some right eyelid fluttering after drinking the energy drink   She reports a previous hx of anemia but does not think this has been an issue for some time    Past Medical History:  Diagnosis Date   Anemia    Chicken pox    Clotting disorder (HCC)    hx blood clots    HTN (hypertension), benign 07/29/2023   Hx of blood clots    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    Patient Active Problem List   Diagnosis Date Noted   HTN (hypertension), benign 07/29/2023   Obesity 07/15/2021   Hx of squamous cell carcinoma of skin 02/15/2021   Hypercoagulable state (HCC) 09/19/2015   History of pulmonary embolism 01/05/2015   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      Home Medications    Prior to Admission medications   Medication  Sig Start Date End Date Taking? Authorizing Provider  ELDERBERRY PO Take 1 tablet by mouth 2 (two) times daily as needed (supplement).    [provider]  hydrochlorothiazide  (HYDRODIURIL ) 12.5 MG tablet Take 1 tablet (12.5 mg total) by mouth daily. 07/29/23   Nche, Roselie Rockford, NP  HYDROcodone -acetaminophen  (NORCO/VICODIN) 5-325 MG tablet Take by mouth. Patient not taking: Reported on 07/29/2023 10/23/17   [provider]  ibuprofen (ADVIL) 200 MG tablet Take 400 mg by mouth every 6 (six) hours as needed for moderate pain (pain score 4-6).    [provider]  Multiple Vitamin (MULTIVITAMIN WITH MINERALS) TABS tablet Take 2 tablets by mouth daily.    [provider]    Family History Family History  Problem Relation Age of Onset   Arthritis Mother    Hypertension Mother    Diabetes Mother    Hypertension Father    Colon polyps Father    Heart attack Maternal Grandfather        19's   Colon cancer Neg Hx    Esophageal cancer Neg Hx    Rectal cancer Neg Hx    Stomach cancer Neg Hx     Social History Social History   Tobacco Use   Smoking status: Never   Smokeless tobacco:  Never  Vaping Use   Vaping status: Never Used  Substance Use Topics   Alcohol use: Not Currently    Comment: Occasional   Drug use: No     Allergies   Patient has no known allergies.   Review of Systems Review of Systems  Constitutional:  Positive for fatigue. Negative for chills and fever.  HENT:  Negative for mouth sores.   Eyes:  Negative for visual disturbance.  Respiratory:  Negative for chest tightness, shortness of breath and wheezing.   Cardiovascular:  Negative for chest pain.  Gastrointestinal:  Negative for diarrhea, nausea and vomiting.       She reports some generalized GI upset but attributes this to her lactose intolerance   Musculoskeletal:  Negative for arthralgias and myalgias.  Skin:  Negative for rash.  Neurological:  Negative for dizziness,  seizures, syncope, speech difficulty, weakness, light-headedness and headaches.  Psychiatric/Behavioral:  Negative for agitation and confusion.      Physical Exam Triage Vital Signs ED Triage Vitals  Encounter Vitals Group     BP 10/10/23 1404 128/86     Girls Systolic BP Percentile --      Girls Diastolic BP Percentile --      Boys Systolic BP Percentile --      Boys Diastolic BP Percentile --      Pulse Rate 10/10/23 1404 75     Resp 10/10/23 1404 18     Temp 10/10/23 1404 98.1 F (36.7 C)     Temp Source 10/10/23 1404 Oral     SpO2 10/10/23 1404 95 %     Weight 10/10/23 1403 230 lb (104.3 kg)     Height 10/10/23 1403 5' 4.5 (1.638 m)     Head Circumference --      Peak Flow --      Pain Score 10/10/23 1403 0     Pain Loc --      Pain Education --      Exclude from Growth Chart --    No data found.  Updated Vital Signs BP 128/86 (BP Location: Right Arm)   Pulse 75   Temp 98.1 F (36.7 C) (Oral)   Resp 18   Ht 5' 4.5 (1.638 m)   Wt 230 lb (104.3 kg)   SpO2 95%   BMI 38.87 kg/m   Visual Acuity Right Eye Distance:   Left Eye Distance:   Bilateral Distance:    Right Eye Near:   Left Eye Near:    Bilateral Near:     Physical Exam Vitals reviewed.  Constitutional:      General: She is awake. She is not in acute distress.    Appearance: Normal appearance. She is well-developed and well-groomed. She is not ill-appearing or toxic-appearing.  HENT:     Head: Normocephalic and atraumatic.     Right Ear: Hearing, tympanic membrane and ear canal normal.     Left Ear: Hearing, tympanic membrane and ear canal normal.     Mouth/Throat:     Lips: Pink.     Mouth: Mucous membranes are moist. No oral lesions.     Tongue: No lesions. Tongue does not deviate from midline.     Palate: No mass and lesions.  Cardiovascular:     Rate and Rhythm: Normal rate and regular rhythm.     Pulses:          Radial pulses are 2+ on the right side and 2+ on the left side.  Posterior tibial pulses are 2+ on the right side and 2+ on the left side.     Heart sounds: Normal heart sounds. No murmur heard.    No friction rub. No gallop.  Pulmonary:     Effort: Pulmonary effort is normal.     Breath sounds: Normal breath sounds. No decreased air movement. No decreased breath sounds, wheezing, rhonchi or rales.  Musculoskeletal:     Cervical back: Normal range of motion and neck supple.     Right lower leg: No edema.     Left lower leg: No edema.  Lymphadenopathy:     Head:     Right side of head: No submental, submandibular or preauricular adenopathy.     Left side of head: No submental, submandibular or preauricular adenopathy.     Cervical:     Right cervical: No superficial cervical adenopathy.    Left cervical: No superficial cervical adenopathy.     Upper Body:     Right upper body: No supraclavicular adenopathy.     Left upper body: No supraclavicular adenopathy.  Skin:    General: Skin is warm and dry.  Neurological:     General: No focal deficit present.     Mental Status: She is alert and oriented to person, place, and time.     GCS: GCS eye subscore is 4. GCS verbal subscore is 5. GCS motor subscore is 6.     Cranial Nerves: No cranial nerve deficit, dysarthria or facial asymmetry.     Motor: No weakness, tremor, atrophy or abnormal muscle tone.     Gait: Gait is intact.  Psychiatric:        Attention and Perception: Attention and perception normal.        Mood and Affect: Mood and affect normal.        Speech: Speech normal.        Behavior: Behavior normal. Behavior is cooperative.        Thought Content: Thought content normal.        Cognition and Memory: Cognition normal.      UC Treatments / Results  Labs (all labs ordered are listed, but only abnormal results are displayed) Labs Reviewed - No data to display  EKG   Radiology No results found.  Procedures Procedures (including critical care time)  Medications Ordered in  UC Medications - No data to display  Initial Impression / Assessment and Plan / UC Course  I have reviewed the triage vital signs and the nursing notes.  Pertinent labs & imaging results that were available during my care of the patient were reviewed by me and considered in my medical decision making (see chart for details).      Final Clinical Impressions(s) / UC Diagnoses   Final diagnoses:  None   Discharge Instructions   None    ED Prescriptions   None    PDMP not reviewed this encounter.

## 2023-10-12 ENCOUNTER — Ambulatory Visit (HOSPITAL_COMMUNITY): Payer: Self-pay

## 2023-10-12 LAB — COMPREHENSIVE METABOLIC PANEL WITH GFR
ALT: 16 IU/L (ref 0–32)
AST: 20 IU/L (ref 0–40)
Albumin: 4.2 g/dL (ref 3.8–4.9)
Alkaline Phosphatase: 71 IU/L (ref 44–121)
BUN/Creatinine Ratio: 11 (ref 9–23)
BUN: 9 mg/dL (ref 6–24)
Bilirubin Total: 0.3 mg/dL (ref 0.0–1.2)
CO2: 21 mmol/L (ref 20–29)
Calcium: 9.2 mg/dL (ref 8.7–10.2)
Chloride: 106 mmol/L (ref 96–106)
Creatinine, Ser: 0.83 mg/dL (ref 0.57–1.00)
Globulin, Total: 3.2 g/dL (ref 1.5–4.5)
Glucose: 117 mg/dL — ABNORMAL HIGH (ref 70–99)
Potassium: 4 mmol/L (ref 3.5–5.2)
Sodium: 140 mmol/L (ref 134–144)
Total Protein: 7.4 g/dL (ref 6.0–8.5)
eGFR: 83 mL/min/1.73 (ref >59)

## 2023-10-12 LAB — CBC WITH DIFFERENTIAL/PLATELET
Basophils Absolute: 0.1 x10E3/uL (ref 0.0–0.2)
Basos: 1 %
EOS (ABSOLUTE): 0.1 x10E3/uL (ref 0.0–0.4)
Eos: 2 %
Hematocrit: 42.1 % (ref 34.0–46.6)
Hemoglobin: 13.3 g/dL (ref 11.1–15.9)
Immature Grans (Abs): 0 x10E3/uL (ref 0.0–0.1)
Immature Granulocytes: 0 %
Lymphocytes Absolute: 2 x10E3/uL (ref 0.7–3.1)
Lymphs: 36 %
MCH: 29.4 pg (ref 26.6–33.0)
MCHC: 31.6 g/dL (ref 31.5–35.7)
MCV: 93 fL (ref 79–97)
Monocytes Absolute: 0.7 x10E3/uL (ref 0.1–0.9)
Monocytes: 12 %
Neutrophils Absolute: 2.8 x10E3/uL (ref 1.4–7.0)
Neutrophils: 49 %
Platelets: 245 x10E3/uL (ref 150–450)
RBC: 4.53 x10E6/uL (ref 3.77–5.28)
RDW: 14.1 % (ref 11.7–15.4)
WBC: 5.6 x10E3/uL (ref 3.4–10.8)

## 2023-10-12 LAB — VITAMIN B12: Vitamin B-12: 415 pg/mL (ref 232–1245)

## 2023-10-12 LAB — VITAMIN D 25 HYDROXY (VIT D DEFICIENCY, FRACTURES): Vit D, 25-Hydroxy: 10.2 ng/mL — ABNORMAL LOW (ref 30.0–100.0)

## 2023-10-12 LAB — IRON: Iron: 91 ug/dL (ref 27–159)

## 2023-10-12 MED ORDER — VITAMIN D (ERGOCALCIFEROL) 1.25 MG (50000 UNIT) PO CAPS: 50000.0000 [IU] | ORAL_CAPSULE | ORAL | 0 refills | Status: AC

## 2023-10-22 ENCOUNTER — Ambulatory Visit: Admitting: Nurse Practitioner

## 2023-11-06 ENCOUNTER — Ambulatory Visit: Admitting: Nurse Practitioner
